# Patient Record
Sex: Female | Born: 1935 | Race: White | Hispanic: No | Smoking: Never smoker
Health system: Southern US, Community
[De-identification: ages and names within clinical notes are randomized; demographics above are authoritative.]

## PROBLEM LIST (undated history)

## (undated) DIAGNOSIS — G459 Transient cerebral ischemic attack, unspecified: Secondary | ICD-10-CM

## (undated) DIAGNOSIS — E039 Hypothyroidism, unspecified: Secondary | ICD-10-CM

## (undated) DIAGNOSIS — I10 Essential (primary) hypertension: Secondary | ICD-10-CM

## (undated) DIAGNOSIS — E785 Hyperlipidemia, unspecified: Secondary | ICD-10-CM

## (undated) DIAGNOSIS — I701 Atherosclerosis of renal artery: Secondary | ICD-10-CM

## (undated) HISTORY — DX: Hyperlipidemia, unspecified: E78.5

## (undated) HISTORY — DX: Essential (primary) hypertension: I10

## (undated) HISTORY — DX: Atherosclerosis of renal artery: I70.1

## (undated) HISTORY — DX: Hypothyroidism, unspecified: E03.9

## (undated) HISTORY — PX: CATARACT EXTRACTION: SUR2

## (undated) HISTORY — DX: Transient cerebral ischemic attack, unspecified: G45.9

---

## 2003-05-03 ENCOUNTER — Encounter: Payer: Self-pay | Admitting: Emergency Medicine

## 2003-05-03 ENCOUNTER — Emergency Department (HOSPITAL_COMMUNITY): Admission: EM | Admit: 2003-05-03 | Discharge: 2003-05-03 | Payer: Self-pay | Admitting: Emergency Medicine

## 2009-01-31 ENCOUNTER — Emergency Department (HOSPITAL_COMMUNITY): Admission: EM | Admit: 2009-01-31 | Discharge: 2009-01-31 | Payer: Self-pay | Admitting: Emergency Medicine

## 2009-04-17 ENCOUNTER — Encounter (INDEPENDENT_AMBULATORY_CARE_PROVIDER_SITE_OTHER): Payer: Self-pay | Admitting: Internal Medicine

## 2009-04-17 ENCOUNTER — Ambulatory Visit: Payer: Self-pay | Admitting: Cardiology

## 2009-04-17 ENCOUNTER — Observation Stay (HOSPITAL_COMMUNITY): Admission: EM | Admit: 2009-04-17 | Discharge: 2009-04-20 | Payer: Self-pay | Admitting: *Deleted

## 2009-05-06 ENCOUNTER — Encounter: Payer: Self-pay | Admitting: Cardiology

## 2009-05-19 ENCOUNTER — Ambulatory Visit (HOSPITAL_COMMUNITY): Admission: RE | Admit: 2009-05-19 | Discharge: 2009-05-19 | Payer: Self-pay | Admitting: Pulmonary Disease

## 2009-05-21 DIAGNOSIS — E785 Hyperlipidemia, unspecified: Secondary | ICD-10-CM | POA: Insufficient documentation

## 2009-05-21 DIAGNOSIS — E039 Hypothyroidism, unspecified: Secondary | ICD-10-CM | POA: Insufficient documentation

## 2009-05-21 DIAGNOSIS — I10 Essential (primary) hypertension: Secondary | ICD-10-CM | POA: Insufficient documentation

## 2009-05-21 DIAGNOSIS — G459 Transient cerebral ischemic attack, unspecified: Secondary | ICD-10-CM | POA: Insufficient documentation

## 2009-05-22 ENCOUNTER — Ambulatory Visit: Payer: Self-pay | Admitting: Cardiovascular Disease

## 2009-05-22 DIAGNOSIS — R9431 Abnormal electrocardiogram [ECG] [EKG]: Secondary | ICD-10-CM

## 2009-05-22 DIAGNOSIS — I701 Atherosclerosis of renal artery: Secondary | ICD-10-CM

## 2009-05-25 ENCOUNTER — Encounter: Payer: Self-pay | Admitting: Cardiovascular Disease

## 2009-05-25 ENCOUNTER — Ambulatory Visit: Payer: Self-pay

## 2009-06-18 ENCOUNTER — Ambulatory Visit: Payer: Self-pay | Admitting: Cardiovascular Disease

## 2010-11-11 ENCOUNTER — Ambulatory Visit (HOSPITAL_COMMUNITY)
Admission: RE | Admit: 2010-11-11 | Discharge: 2010-11-11 | Disposition: A | Discharge status: Home / Self Care | Payer: Medicare Other | Source: Ambulatory Visit | Attending: Pulmonary Disease | Admitting: Pulmonary Disease

## 2010-11-11 ENCOUNTER — Other Ambulatory Visit (HOSPITAL_COMMUNITY): Payer: Self-pay | Admitting: Pulmonary Disease

## 2010-11-11 DIAGNOSIS — R05 Cough: Secondary | ICD-10-CM

## 2010-11-11 DIAGNOSIS — J4489 Other specified chronic obstructive pulmonary disease: Secondary | ICD-10-CM | POA: Insufficient documentation

## 2010-11-11 DIAGNOSIS — R059 Cough, unspecified: Secondary | ICD-10-CM

## 2010-11-11 DIAGNOSIS — J449 Chronic obstructive pulmonary disease, unspecified: Secondary | ICD-10-CM | POA: Insufficient documentation

## 2010-11-19 LAB — LIPID PANEL
Cholesterol: 235 mg/dL — ABNORMAL HIGH (ref 0–200)
HDL: 63 mg/dL (ref >39)
Total CHOL/HDL Ratio: 3.7 RATIO

## 2010-11-19 LAB — DIFFERENTIAL
Basophils Absolute: 0 10*3/uL (ref 0.0–0.1)
Basophils Relative: 1 % (ref 0–1)
Eosinophils Absolute: 0.2 10*3/uL (ref 0.0–0.7)
Eosinophils Relative: 4 % (ref 0–5)
Neutrophils Relative %: 51 % (ref 43–77)

## 2010-11-19 LAB — CBC
HCT: 38.7 % (ref 36.0–46.0)
MCV: 87.6 fL (ref 78.0–100.0)
Platelets: 138 10*3/uL — ABNORMAL LOW (ref 150–400)
RDW: 13.4 % (ref 11.5–15.5)

## 2010-11-19 LAB — BASIC METABOLIC PANEL
BUN: 22 mg/dL (ref 6–23)
Chloride: 103 mEq/L (ref 96–112)
Creatinine, Ser: 1.06 mg/dL (ref 0.4–1.2)
Glucose, Bld: 100 mg/dL — ABNORMAL HIGH (ref 70–99)
Potassium: 3.7 mEq/L (ref 3.5–5.1)

## 2011-07-14 ENCOUNTER — Encounter: Payer: Self-pay | Admitting: Cardiovascular Disease

## 2012-07-12 ENCOUNTER — Observation Stay (HOSPITAL_COMMUNITY)
Admission: EM | Admit: 2012-07-12 | Discharge: 2012-07-14 | Disposition: A | Discharge status: Home Health | Payer: Medicare Other | Attending: Pulmonary Disease | Admitting: Pulmonary Disease

## 2012-07-12 ENCOUNTER — Encounter (HOSPITAL_COMMUNITY): Payer: Self-pay | Admitting: *Deleted

## 2012-07-12 ENCOUNTER — Emergency Department (HOSPITAL_COMMUNITY): Payer: Medicare Other

## 2012-07-12 ENCOUNTER — Other Ambulatory Visit: Payer: Self-pay

## 2012-07-12 DIAGNOSIS — E860 Dehydration: Secondary | ICD-10-CM | POA: Diagnosis present

## 2012-07-12 DIAGNOSIS — E871 Hypo-osmolality and hyponatremia: Secondary | ICD-10-CM | POA: Diagnosis present

## 2012-07-12 DIAGNOSIS — R9431 Abnormal electrocardiogram [ECG] [EKG]: Secondary | ICD-10-CM | POA: Diagnosis present

## 2012-07-12 DIAGNOSIS — E785 Hyperlipidemia, unspecified: Secondary | ICD-10-CM | POA: Diagnosis present

## 2012-07-12 DIAGNOSIS — I719 Aortic aneurysm of unspecified site, without rupture: Secondary | ICD-10-CM | POA: Diagnosis present

## 2012-07-12 DIAGNOSIS — R252 Cramp and spasm: Secondary | ICD-10-CM | POA: Diagnosis present

## 2012-07-12 DIAGNOSIS — I10 Essential (primary) hypertension: Secondary | ICD-10-CM | POA: Diagnosis present

## 2012-07-12 DIAGNOSIS — I951 Orthostatic hypotension: Secondary | ICD-10-CM | POA: Diagnosis present

## 2012-07-12 DIAGNOSIS — Q211 Atrial septal defect: Secondary | ICD-10-CM

## 2012-07-12 DIAGNOSIS — R55 Syncope and collapse: Principal | ICD-10-CM | POA: Diagnosis present

## 2012-07-12 DIAGNOSIS — I701 Atherosclerosis of renal artery: Secondary | ICD-10-CM | POA: Diagnosis present

## 2012-07-12 DIAGNOSIS — Q2112 Patent foramen ovale: Secondary | ICD-10-CM

## 2012-07-12 DIAGNOSIS — Z7982 Long term (current) use of aspirin: Secondary | ICD-10-CM | POA: Insufficient documentation

## 2012-07-12 DIAGNOSIS — N390 Urinary tract infection, site not specified: Secondary | ICD-10-CM | POA: Insufficient documentation

## 2012-07-12 DIAGNOSIS — Q2111 Secundum atrial septal defect: Secondary | ICD-10-CM | POA: Insufficient documentation

## 2012-07-12 DIAGNOSIS — E039 Hypothyroidism, unspecified: Secondary | ICD-10-CM | POA: Diagnosis present

## 2012-07-12 LAB — CBC
HCT: 30.3 % — ABNORMAL LOW (ref 36.0–46.0)
Hemoglobin: 10.5 g/dL — ABNORMAL LOW (ref 12.0–15.0)
MCH: 29.4 pg (ref 26.0–34.0)
MCHC: 34.7 g/dL (ref 30.0–36.0)
MCV: 84.9 fL (ref 78.0–100.0)
Platelets: 155 10*3/uL (ref 150–400)
RBC: 3.57 MIL/uL — ABNORMAL LOW (ref 3.87–5.11)
RDW: 12.8 % (ref 11.5–15.5)
WBC: 5.7 10*3/uL (ref 4.0–10.5)

## 2012-07-12 MED ORDER — ONDANSETRON HCL 4 MG/2ML IJ SOLN
INTRAMUSCULAR | Status: AC
  Administered 2012-07-12: 4 mg
  Filled 2012-07-12: qty 2

## 2012-07-12 MED ORDER — SODIUM CHLORIDE 0.9 % IV BOLUS (SEPSIS)
1000.0000 mL | Freq: Once | INTRAVENOUS | Status: AC
  Administered 2012-07-12: 1000 mL via INTRAVENOUS

## 2012-07-12 NOTE — ED Notes (Signed)
Right hand swollen and bruised from removal of heplock per judith rice ,rn.

## 2012-07-12 NOTE — ED Notes (Signed)
Pt states fell on left side and hip area, large raised area noted to said hip, no redness noted. Small quarter sized bruise noted. Pt states hip stings.  Bilateral lower extremities appear equal in size.  Pt states " just feel sick/bad". And is c/o nauseated. EMS administered 4 mg Zofran in route secondary to said nausea. Pt states was feeling faint, unsteady on her feet before falling. Pt denies LOC. Per both pt and EMS pt was up ambulating after fall without difficulty and a steady gait.  Pt denies use of cane or walker at home. NAD noted

## 2012-07-12 NOTE — ED Notes (Signed)
Per EMS pt had near syncope episode, Fall to left hip/side and is nauseated. EMS administered 4 mg Zofran and established an IV. Pt is from home, lives alone.

## 2012-07-12 NOTE — ED Provider Notes (Signed)
History   This chart was scribed for Raeford Razor, MD by Gerlean Ren, ED Scribe. This patient was seen in room APA02/APA02 and the patient's care was started at 11:04 PM    CSN: [id number]  Arrival date & time 07/12/12  2200   First MD Initiated Contact with Patient 07/12/12 2300      Chief Complaint  Patient presents with  . Fall  . Fatigue  . Nausea     The history is provided by the patient. No language interpreter was used.   Leah Casey is a 78 y.o. female with h/o HTN and TIA who presents to the Emergency Department complaining of syncopal episode tonight while bending over with no LOC. Pt reports only mild left hip pain from fall.  Per daughter, pt has h/o similar syncopal episodes but never loses consciousness and has never seen a neurologist.  Pt denies any obvious triggers or warnings when syncope will occur.  Pt denies associated dyspnea.  Pt states she has felt near-syncopal for the entire day.  Per daughter, pt has eaten regularly today.  Pt denies tobacco and alcohol use.     Past Medical History  Diagnosis Date  . Hypothyroidism   . Hyperlipidemia   . Hypertension   . TIA (transient ischemic attack)   . Renal artery stenosis     Past Surgical History  Procedure Date  . Cataract extraction     Right    Family History  Problem Relation Age of Onset  . Heart disease Mother   . Hypertension Mother     History  Substance Use Topics  . Smoking status: Unknown If Ever Smoked  . Smokeless tobacco: Not on file  . Alcohol Use: No    No OB history provided.   Review of Systems  Respiratory: Negative for shortness of breath.   Neurological: Positive for syncope.  All other systems reviewed and are negative.    Allergies  Sulfonamide derivatives  Home Medications   Current Outpatient Rx  Name  Route  Sig  Dispense  Refill  . ASPIRIN EC 81 MG PO TBEC   Oral   Take 81 mg by mouth every morning.         Marland Kitchen CLOPIDOGREL BISULFATE 75 MG PO  TABS   Oral   Take 75 mg by mouth every morning.          Marland Kitchen LEVOTHYROXINE SODIUM 125 MCG PO TABS   Oral   Take 125 mcg by mouth every morning. *Takes 30 minutes to 1 hour before taking other medications         . METOPROLOL SUCCINATE ER 50 MG PO TB24   Oral   Take 50 mg by mouth every morning.          Marland Kitchen OLMESARTAN-AMLODIPINE-HCTZ 40-5-12.5 MG PO TABS   Oral   Take 1 tablet by mouth every morning.          Marland Kitchen SYSTANE OP   Ophthalmic   Apply 1 drop to eye daily as needed. For dry eye relief         . PRAVASTATIN SODIUM 40 MG PO TABS   Oral   Take 40 mg by mouth every morning.            BP 161/51  Pulse 74  Temp 97.7 F (36.5 C) (Oral)  Resp 14  Ht 5\' 5"  (1.651 m)  Wt 120 lb (54.432 kg)  BMI 19.97 kg/m2  SpO2 98%  Physical Exam  Nursing note and vitals reviewed. Constitutional: She appears well-developed and well-nourished. No distress.       Not very comfortable appearing.  HENT:  Head: Normocephalic and atraumatic.  Eyes: Conjunctivae normal are normal. Right eye exhibits no discharge. Left eye exhibits no discharge.  Neck: Neck supple.  Cardiovascular: Normal rate, regular rhythm and normal heart sounds.  Exam reveals no gallop and no friction rub.   No murmur heard. Pulmonary/Chest: Effort normal and breath sounds normal. No respiratory distress.  Abdominal: Soft. She exhibits no distension. There is no tenderness.  Musculoskeletal: She exhibits no edema and no tenderness.       Hematoma over left lateral hip with tenderness.  Pelvis stable to rocking.  Pain with ROM left hip.  Neurovascularly intact distally.  No midline spinal tenderness.  Neurological: She is alert.  Skin: Skin is warm and dry.  Psychiatric: She has a normal mood and affect. Her behavior is normal. Thought content normal.    ED Course  Procedures (including critical care time) DIAGNOSTIC STUDIES: Oxygen Saturation is 98% on room air, normal by my interpretation.     COORDINATION OF CARE: 11:17 PM- Patient informed of clinical course, understands medical decision-making process, and agrees with plan.  Ordered PO Zofran, IV fluids, CBC, urinalysis, troponin, left hip CT w/o contrast.  Labs Reviewed  CBC - Abnormal; Notable for the following:    RBC 3.57 (*)     Hemoglobin 10.5 (*)     HCT 30.3 (*)     All other components within normal limits  BASIC METABOLIC PANEL  URINALYSIS, ROUTINE W REFLEX MICROSCOPIC  TROPONIN I   Dg Hip Complete Left  07/12/2012  *RADIOLOGY REPORT*  Clinical Data: Fall complaining of left hip pain.  LEFT HIP - COMPLETE 2+ VIEW  Comparison: No priors.  Findings: AP view of the pelvis and AP and lateral views of the left hip demonstrate no acute displaced fracture, subluxation, dislocation, joint or soft tissue abnormality.  Mild osteoarthritis is noted the hip joints bilaterally.  The bony pelvis appears grossly intact, as does the visualized portions of the right proximal femur.  IMPRESSION: 1.  No acute radiographic abnormality of the bony pelvis or the left hip. 2.  Mild bilateral hip joint osteoarthritis.   Original Report Authenticated By: Trudie Reed, M.D.    EKG:  Rhythm: normal sinus Vent. rate 77 BPM PR interval 224 ms QRS duration 146 ms QT/QTc 444/502 ms: ST segments: NS ST changes   1. Near syncope   2. Aortic aneurysm   3. Dehydration   4. Hyponatremia   5. HYPERTENSION       MDM  78 year old female with near syncope. Patient has not actually syncopized but she's been symptomatic enough to fall. These episodes have been recurrent as well. Patient's EKG shows new T wave inversions anteriorly and QTc is a little bit prolonged at 502 ms.  I personally preformed the services scribed in my presence. The recorded information has been reviewed is accurate. Raeford Razor, MD.        Raeford Razor, MD 07/16/12 [id number]

## 2012-07-13 ENCOUNTER — Observation Stay (HOSPITAL_COMMUNITY): Payer: Medicare Other

## 2012-07-13 ENCOUNTER — Encounter (HOSPITAL_COMMUNITY): Payer: Self-pay | Admitting: *Deleted

## 2012-07-13 DIAGNOSIS — I719 Aortic aneurysm of unspecified site, without rupture: Secondary | ICD-10-CM | POA: Diagnosis present

## 2012-07-13 DIAGNOSIS — E860 Dehydration: Secondary | ICD-10-CM | POA: Diagnosis present

## 2012-07-13 DIAGNOSIS — E871 Hypo-osmolality and hyponatremia: Secondary | ICD-10-CM

## 2012-07-13 DIAGNOSIS — R252 Cramp and spasm: Secondary | ICD-10-CM | POA: Diagnosis present

## 2012-07-13 DIAGNOSIS — R55 Syncope and collapse: Secondary | ICD-10-CM

## 2012-07-13 DIAGNOSIS — Q211 Atrial septal defect: Secondary | ICD-10-CM

## 2012-07-13 LAB — URINE MICROSCOPIC-ADD ON

## 2012-07-13 LAB — TSH: TSH: 0.222 u[IU]/mL — ABNORMAL LOW (ref 0.350–4.500)

## 2012-07-13 LAB — URINALYSIS, ROUTINE W REFLEX MICROSCOPIC
Bilirubin Urine: NEGATIVE
Glucose, UA: NEGATIVE mg/dL
Ketones, ur: NEGATIVE mg/dL
Leukocytes, UA: NEGATIVE
Nitrite: NEGATIVE
Protein, ur: NEGATIVE mg/dL
Specific Gravity, Urine: 1.015 (ref 1.005–1.030)
Urobilinogen, UA: 0.2 mg/dL (ref 0.0–1.0)
pH: 7 (ref 5.0–8.0)

## 2012-07-13 LAB — BASIC METABOLIC PANEL
BUN: 33 mg/dL — ABNORMAL HIGH (ref 6–23)
CO2: 24 mEq/L (ref 19–32)
Calcium: 8.9 mg/dL (ref 8.4–10.5)
Chloride: 98 mEq/L (ref 96–112)
Creatinine, Ser: 1.06 mg/dL (ref 0.50–1.10)
GFR calc Af Amer: 50 mL/min — ABNORMAL LOW (ref >90)
GFR calc non Af Amer: 43 mL/min — ABNORMAL LOW (ref >90)
Glucose, Bld: 133 mg/dL — ABNORMAL HIGH (ref 70–99)
Potassium: 4.2 mEq/L (ref 3.5–5.1)
Sodium: 131 mEq/L — ABNORMAL LOW (ref 135–145)

## 2012-07-13 LAB — HEPATIC FUNCTION PANEL
ALT: 20 U/L (ref 0–35)
Bilirubin, Direct: <0.1 mg/dL (ref 0.0–0.3)
Total Protein: 6.2 g/dL (ref 6.0–8.3)

## 2012-07-13 LAB — TROPONIN I: Troponin I: <0.3 ng/mL (ref <0.30)

## 2012-07-13 LAB — D-DIMER, QUANTITATIVE: D-Dimer, Quant: 2.27 ug/mL-FEU — ABNORMAL HIGH (ref 0.00–0.48)

## 2012-07-13 LAB — HEMOGLOBIN A1C: Mean Plasma Glucose: 123 mg/dL — ABNORMAL HIGH (ref <117)

## 2012-07-13 LAB — MAGNESIUM: Magnesium: 1.8 mg/dL (ref 1.5–2.5)

## 2012-07-13 MED ORDER — SODIUM CHLORIDE 0.9 % IJ SOLN
3.0000 mL | Freq: Two times a day (BID) | INTRAMUSCULAR | Status: DC
  Administered 2012-07-13: 3 mL via INTRAVENOUS

## 2012-07-13 MED ORDER — LEVOTHYROXINE SODIUM 25 MCG PO TABS
125.0000 ug | ORAL_TABLET | Freq: Every morning | ORAL | Status: DC
  Administered 2012-07-13 – 2012-07-14 (×2): 125 ug via ORAL
  Filled 2012-07-13 (×2): qty 1

## 2012-07-13 MED ORDER — IOHEXOL 350 MG/ML SOLN
100.0000 mL | Freq: Once | INTRAVENOUS | Status: AC | PRN
  Administered 2012-07-13: 100 mL via INTRAVENOUS

## 2012-07-13 MED ORDER — CLOPIDOGREL BISULFATE 75 MG PO TABS
75.0000 mg | ORAL_TABLET | Freq: Every morning | ORAL | Status: DC
  Administered 2012-07-13 – 2012-07-14 (×2): 75 mg via ORAL
  Filled 2012-07-13 (×2): qty 1

## 2012-07-13 MED ORDER — POLYETHYL GLYCOL-PROPYL GLYCOL 0.4-0.3 % OP SOLN: 1.0000 [drp] | Freq: Three times a day (TID) | OPHTHALMIC | Status: DC | PRN

## 2012-07-13 MED ORDER — DEXTROSE 5 % IV SOLN
1.0000 g | INTRAVENOUS | Status: DC
  Administered 2012-07-13 – 2012-07-14 (×2): 1 g via INTRAVENOUS
  Filled 2012-07-13 (×4): qty 10

## 2012-07-13 MED ORDER — ENOXAPARIN SODIUM 40 MG/0.4ML ~~LOC~~ SOLN: 40.0000 mg | SUBCUTANEOUS | Status: DC

## 2012-07-13 MED ORDER — ASPIRIN EC 81 MG PO TBEC
81.0000 mg | DELAYED_RELEASE_TABLET | Freq: Every morning | ORAL | Status: DC
  Administered 2012-07-13 – 2012-07-14 (×2): 81 mg via ORAL
  Filled 2012-07-13 (×2): qty 1

## 2012-07-13 MED ORDER — POLYVINYL ALCOHOL 1.4 % OP SOLN: 1.0000 [drp] | Freq: Three times a day (TID) | OPHTHALMIC | Status: DC | PRN

## 2012-07-13 MED ORDER — BISACODYL 5 MG PO TBEC: 5.0000 mg | DELAYED_RELEASE_TABLET | Freq: Every day | ORAL | Status: DC | PRN

## 2012-07-13 MED ORDER — ENOXAPARIN SODIUM 30 MG/0.3ML ~~LOC~~ SOLN
30.0000 mg | SUBCUTANEOUS | Status: DC
  Administered 2012-07-13: 30 mg via SUBCUTANEOUS
  Filled 2012-07-13 (×2): qty 0.3

## 2012-07-13 MED ORDER — AMLODIPINE BESYLATE 5 MG PO TABS: 5.0000 mg | ORAL_TABLET | Freq: Every day | ORAL | Status: DC

## 2012-07-13 MED ORDER — TRAZODONE HCL 50 MG PO TABS: 25.0000 mg | ORAL_TABLET | Freq: Every evening | ORAL | Status: DC | PRN

## 2012-07-13 MED ORDER — ACETAMINOPHEN 325 MG PO TABS: 650.0000 mg | ORAL_TABLET | Freq: Four times a day (QID) | ORAL | Status: DC | PRN

## 2012-07-13 MED ORDER — FLEET ENEMA 7-19 GM/118ML RE ENEM: 1.0000 | ENEMA | Freq: Once | RECTAL | Status: AC | PRN

## 2012-07-13 MED ORDER — IRBESARTAN 300 MG PO TABS
300.0000 mg | ORAL_TABLET | Freq: Every day | ORAL | Status: DC
  Filled 2012-07-13: qty 1

## 2012-07-13 MED ORDER — ONDANSETRON HCL 4 MG/2ML IJ SOLN: 4.0000 mg | Freq: Four times a day (QID) | INTRAMUSCULAR | Status: DC | PRN

## 2012-07-13 MED ORDER — POTASSIUM CHLORIDE IN NACL 20-0.9 MEQ/L-% IV SOLN
INTRAVENOUS | Status: DC
  Administered 2012-07-13: 18:00:00 via INTRAVENOUS
  Administered 2012-07-13: 1000 mL via INTRAVENOUS
  Administered 2012-07-14: 09:00:00 via INTRAVENOUS

## 2012-07-13 MED ORDER — METOPROLOL SUCCINATE ER 50 MG PO TB24: 50.0000 mg | ORAL_TABLET | Freq: Every morning | ORAL | Status: DC

## 2012-07-13 MED ORDER — SIMVASTATIN 20 MG PO TABS
20.0000 mg | ORAL_TABLET | Freq: Every day | ORAL | Status: DC
  Administered 2012-07-13: 20 mg via ORAL
  Filled 2012-07-13: qty 1

## 2012-07-13 NOTE — Progress Notes (Signed)
UR Chart Review Completed  

## 2012-07-13 NOTE — Progress Notes (Signed)
This is an assumption of care note. She is a 78 year old with a history of hypertension and what appears to have been renal artery stenosis. She did not undergo stenting of that considering her advanced age Leah Casey. She has been having near syncope and had an overt syncopal episode last night. She has had pain from a fall. She has an elevated d-dimer which may be related to the fall. She will need workup of this including carotid Doppler, echocardiogram, CT of the chest because of the elevated d-dimer and she may have a UTI so she's going to be on an antibiotic

## 2012-07-13 NOTE — Care Management Note (Unsigned)
    Page 1 of 1   07/13/2012     3:16:27 PM   CARE MANAGEMENT NOTE 07/13/2012  Patient:  SHERONICA, COREY   Account Number:  [id number]  Date Initiated:  07/13/2012  Documentation initiated by:  Sharrie Rothman  Subjective/Objective Assessment:   Pt admitted from home with syncope and possible UTI. Pt lives alone and has a daughter who lives next door and a son that lives in [place]. Pt wants to return home and has been fairly indpendent with ADL's.     Action/Plan:   CM provided a list of private duty sitter agencies to the pts son. Pt and family to discuss HH. If pt decides to have HH, weekend staff to arrange [facility] with agency that family chooses.   Anticipated DC Date:  07/14/2012   Anticipated DC Plan:  HOME W HOME HEALTH SERVICES      DC Planning Services  CM consult      Choice offered to / List presented to:             Status of service:  In process, will continue to follow Medicare Important Message given?   (If response is "NO", the following Medicare IM given date fields will be blank) Date Medicare IM given:   Date Additional Medicare IM given:    Discharge Disposition:    Per UR Regulation:    If discussed at Long Length of Stay Meetings, dates discussed:    Comments:  07/13/12 1515 Arlyss Queen, RN BSN CM

## 2012-07-13 NOTE — H&P (Signed)
[facility] History and Physical  Leah Casey  ZOX:[id number]  DOB: 1914/02/08   DOA: 07/13/2012   PCP:   Fredirick Maudlin, MD   Chief Complaint:  Recurrent dizziness  HPI: Leah Casey is an 78 y.o. female.   Mentally sharp, but very hard of hearing, elderly Caucasian lady who lives alone, appear to have been having recurrent episodes of near syncope for quite a while. Episodes occur at least weekly, and she has had> 4 episodes over the past 2 days. It this last episode today she felt dizzy, and then fell to the ground and injured her left hip. She did not lose consciousness, but says the episodes are associated with a fluttering in her chest.  She has no diaphoresis, chest pain or leg edema; denies nausea or vomiting. Her 2 daughters who are accompanying her says she probably has not been eating and drinking well. She admits to being very thirsty right now; she also complains of episodes of muscle cramping.  From the records she does have a history of relatively small thoracic and abdominal aortic aneurysms, diagnosed about 3 years ago, along with moderate bilateral renal artery stenosis.  She has had episodes of TIA characterized by transient loss of vision in one eye and weakness of one arm, for which she takes aspirin and Plavix, although she is not always compliant with aspirin.  X-rays in the emergency room reveal no evidence of fractures, and the hospitalist service was called to assist with management of recurrent syncope.  Rewiew of Systems:   All systems negative except as marked bold or noted in the HPI;  Constitutional: Negative for malaise, fever and chills. ;  Eyes: Negative for eye pain, redness and discharge. ;  ENMT: Negative for ear pain, hoarseness, nasal congestion, sinus pressure and sore throat. ;  Cardiovascular: Negative for chest pain,  diaphoresis, dyspnea and peripheral edema. ;  Respiratory: Negative for cough, hemoptysis, wheezing and stridor. ;    Gastrointestinal: Negative for nausea, vomiting, diarrhea, constipation, abdominal pain, melena, blood in stool, hematemesis, jaundice and rectal bleeding. unusual weight loss..   Genitourinary: Negative for frequency, dysuria, incontinence,flank pain and hematuria; Musculoskeletal: Negative for back pain and neck pain. Pain and swelling left hip from fall today.;  Skin: . Negative for pruritus, rash, abrasions,  and skin lesion.; ulcerations; bruise left hip Neuro: Negative for headache, and neck stiffness. Negative for weakness, altered level of consciousness , altered mental status, extremity weakness, burning feet, involuntary movement, seizure .  Psych: negative for anxiety, depression, insomnia, tearfulness, panic attacks, hallucinations, paranoia, suicidal or homicidal ideation    Past Medical History  Diagnosis Date  . Hypothyroidism   . Hyperlipidemia   . Hypertension   . TIA (transient ischemic attack)   . Renal artery stenosis     Past Surgical History  Procedure Date  . Cataract extraction     Right    Medications:  HOME MEDS: Prior to Admission medications   Medication Sig Start Date End Date Taking? Authorizing Provider  aspirin EC 81 MG tablet Take 81 mg by mouth every morning.   Yes Historical Provider, MD  clopidogrel (PLAVIX) 75 MG tablet Take 75 mg by mouth every morning.    Yes Historical Provider, MD  levothyroxine (SYNTHROID, LEVOTHROID) 125 MCG tablet Take 125 mcg by mouth every morning. *Takes 30 minutes to 1 hour before taking other medications   Yes Historical Provider, MD  metoprolol (TOPROL-XL) 50 MG 24 hr tablet Take 50 mg by mouth  every morning.    Yes Historical Provider, MD  Olmesartan-Amlodipine-HCTZ (TRIBENZOR) 40-5-12.5 MG TABS Take 1 tablet by mouth every morning.    Yes Historical Provider, MD  Polyethyl Glycol-Propyl Glycol (SYSTANE OP) Apply 1 drop to eye daily as needed. For dry eye relief   Yes Historical Provider, MD  pravastatin  (PRAVACHOL) 40 MG tablet Take 40 mg by mouth every morning.    Yes Historical Provider, MD     Allergies:  Allergies  Allergen Reactions  . Sulfonamide Derivatives     Social History:   has an unknown smoking status. She does not have any smokeless tobacco history on file. She reports that she does not drink alcohol or use illicit drugs.  Family History: Family History  Problem Relation Age of Onset  . Heart disease Mother   . Hypertension Mother      Physical Exam: Filed Vitals:   07/12/12 2328 07/13/12 0200 07/13/12 0215 07/13/12 0342  BP: 178/59   166/45  Pulse: 77 81 80 75  Temp:    98.1 F (36.7 C)  TempSrc:    Oral  Resp: 18 11 19 18   Height:    5\' 4"  (1.626 m)  Weight:    52.617 kg (116 lb)  SpO2: 98% 97% 96% 99%   Blood pressure 166/45, pulse 75, temperature 98.1 F (36.7 C), temperature source Oral, resp. rate 18, height 5\' 4"  (1.626 m), weight 52.617 kg (116 lb), SpO2 99.00%.  GEN:  Pleasant elderly Caucasian lady lying flat in the stretcher in no acute distress; cooperative with exam, but extremely hard of hearing despite bilateral hearing aids. PSYCH:  alert and oriented x4; does not appear anxious or depressed; affect is appropriate. HEENT: Mucous membranes pink, dry and anicteric; PERRLA; EOM intact; no cervical lymphadenopathy nor thyromegaly or carotid bruit; no JVD; Breasts:: Not examined CHEST WALL: No tenderness CHEST: Normal respiration, clear to auscultation bilaterally HEART: Regular rate and rhythm; early systolic murmur radiating more to the left carotid BACK: Marked kyphosis no scoliosis; no CVA tenderness ABDOMEN: soft non-tender; no masses, no organomegaly, normal abdominal bowel sounds; soft abdominal bruit no pannus; no intertriginous candida. Rectal Exam: Not done EXTREMITIES: No bone or joint deformity; age-appropriate arthropathy of the hands and knees; no edema; no ulcerations. Large Hematoma over left greater trochanter. Genitalia: not  examined PULSES: 2+ and symmetric; bounding radial pulses SKIN: Normal hydration no rash or ulceration CNS: Cranial nerves 2-12 grossly intact no focal lateralizing neurologic deficit   Labs on Admission:  Basic Metabolic Panel:  Lab 07/12/12 1610  NA 131*  K 4.2  CL 98  CO2 24  GLUCOSE 133*  BUN 33*  CREATININE 1.06  CALCIUM 8.9  MG --  PHOS --   Liver Function Tests: No results found for this basename: AST:5,ALT:5,ALKPHOS:5,BILITOT:5,PROT:5,ALBUMIN:5 in the last 168 hours No results found for this basename: LIPASE:5,AMYLASE:5 in the last 168 hours No results found for this basename: AMMONIA:5 in the last 168 hours CBC:  Lab 07/12/12 2333  WBC 5.7  NEUTROABS --  HGB 10.5*  HCT 30.3*  MCV 84.9  PLT 155   Cardiac Enzymes:  Lab 07/12/12 2333  CKTOTAL --  CKMB --  CKMBINDEX --  TROPONINI <0.30   BNP: No components found with this basename: POCBNP:5 D-dimer: No components found with this basename: D-DIMER:5 CBG: No results found for this basename: GLUCAP:5 in the last 168 hours  Radiological Exams on Admission: Dg Hip Complete Left  07/12/2012  *RADIOLOGY REPORT*  Clinical Data: Fall  complaining of left hip pain.  LEFT HIP - COMPLETE 2+ VIEW  Comparison: No priors.  Findings: AP view of the pelvis and AP and lateral views of the left hip demonstrate no acute displaced fracture, subluxation, dislocation, joint or soft tissue abnormality.  Mild osteoarthritis is noted the hip joints bilaterally.  The bony pelvis appears grossly intact, as does the visualized portions of the right proximal femur.  IMPRESSION: 1.  No acute radiographic abnormality of the bony pelvis or the left hip. 2.  Mild bilateral hip joint osteoarthritis.   Original Report Authenticated By: Trudie Reed, M.D.    Ct Hip Left Wo Contrast  07/13/2012  *RADIOLOGY REPORT*  Clinical Data: Status post fall; left hip pain.  CT OF THE LEFT HIP WITHOUT CONTRAST  Technique:  Multidetector CT imaging was  performed according to the standard protocol. Multiplanar CT image reconstructions were also generated.  Comparison: Left hip radiographs performed earlier today at 10:32 p.m.  Findings: There is no definite evidence of fracture or dislocation. The left femoral neck appears grossly intact on CT.  If the patient's symptoms persist, MRI could be considered for further evaluation, to exclude an occult nondisplaced fracture.  Both femoral heads remain seated at their respective acetabula. Mild vacuum phenomenon is seen at the sacroiliac joints; the pelvis is otherwise grossly unremarkable in appearance.  Mild degenerative change is noted at the pubic symphysis.  Focal soft tissue injury is noted overlying the left greater femoral trochanter and proximal left femur.  No hip joint effusion is identified.  Scattered vascular calcifications are seen. Diffuse diverticulosis is noted along the sigmoid colon.  IMPRESSION:  1.  No definite evidence of fracture or dislocation.  If the patient's symptoms persist, MRI could be considered for further evaluation, to exclude an occult nondisplaced fracture. 2.  Focal soft tissue injury overlying the left greater femoral trochanter and proximal left femur; no significant focal hematoma seen. 3.  Diffuse diverticulosis along the sigmoid colon. 4.  Scattered vascular calcifications seen.   Original Report Authenticated By: Tonia Ghent, M.D.     EKG: Independently reviewed. Sinus rhythm, first-degree AV block, right bundle branch block, not significantly changed from previous EKG 2010.   Assessment/Plan Present on Admission:  . Near syncope  Causes include dehydration , but given her history of palpitation, and long QT on EKG, cardiac arrhythmia also possible  . Dehydration, evidence by marked elevation in BUN/creatinine ratio, and ferrous  . Hyponatremia - probably due to dehydration  . Muscle cramps =- probably due to hyponatremia  . HYPOTHYROIDISM . HYPERLIPIDEMIA,  CONTROLLED . HYPERTENSION . ELECTROCARDIOGRAM, ABNORMAL . RENAL ARTERY STENOSIS . Aortic aneurysm   PLAN: We'll admit this lady for hydration and monitoring of her electrolytes;  repeat EKG in the morning, but not sure if  echo will be possible over this holiday period.  Ultrasound of the abdomen and the renal to assess for progression of aneurysm and stenosis. When she is hydrated may consider CT angiogram of the chest and abdomen to rule out episodic bleeding from aortic aneurysms  Pulmonary embolus also possible in the differential  Other plans as per orders.  Code Status: DO NOT  INTUBATE Family Communication: With daughter is present for interview and exam Disposition Plan: Per primary care physician later this morning    Jaquasia Doscher Nocturnist [facility] Pager [phone]   07/13/2012, 3:51 AM

## 2012-07-13 NOTE — Evaluation (Signed)
Physical Therapy Evaluation Patient Details Name: Leah Casey MRN: [id number] DOB: 1913/11/23 Today's Date: 07/13/2012 Time: 4098-1191 PT Time Calculation (min): 32 min  PT Assessment / Plan / Recommendation Clinical Impression  Pt is found to be at prior functional level on eval.  She did have orthostatic hypotension (decreased from:140/41 supine, 130/39 sitting, 118/40 standing) but had no sx of orthostasis.  She is up in a chair.  I have encouraged her to pauseafter a change of position before moving about to minimize syncope.    PT Assessment  Patent does not need any further PT services    Follow Up Recommendations  No PT follow up    Does the patient have the potential to tolerate intense rehabilitation      Barriers to Discharge        Equipment Recommendations  None recommended by PT    Recommendations for Other Services     Frequency      Precautions / Restrictions Precautions Precautions: Fall Restrictions Weight Bearing Restrictions: No   Pertinent Vitals/Pain      Mobility  Bed Mobility Bed Mobility: Supine to Sit;Sit to Supine Supine to Sit: 7: Independent Sit to Supine: 7: Independent Transfers Transfers: Stand to Sit;Sit to Stand Sit to Stand: 7: Independent Stand to Sit: 7: Independent Ambulation/Gait Ambulation/Gait Assistance: 6: Modified independent (Device/Increase time) Ambulation Distance (Feet): 250 Feet Assistive device: None Gait Pattern: Within Functional Limits Gait velocity: WNL General Gait Details: no instability Stairs: No Wheelchair Mobility Wheelchair Mobility: No    Shoulder Instructions     Exercises     PT Diagnosis:    PT Problem List:   PT Treatment Interventions:     PT Goals    Visit Information  Last PT Received On: 07/13/12    Subjective Data  Subjective: I'm feeling much better Patient Stated Goal: return home   Prior Functioning  Home Living Lives With: Alone Available Help at Discharge:  Family;Available 24 hours/day Type of Home: House Home Access: Level entry Home Layout: Laundry or work area in basement [employer]: Engineer, mining: None Prior Function Level of Independence: Independent Able to Take Stairs?: Yes Driving: Yes Vocation: Retired Comments: pt is a Leisure centre manager [employer] Communication Communication: HOH    Cognition  Overall Cognitive Status: Appears within functional limits for tasks assessed/performed Arousal/Alertness: Awake/alert Orientation Level: Appears intact for tasks assessed Behavior During Session: [facility] for tasks performed    Extremity/Trunk Assessment Right Upper Extremity Assessment RUE ROM/Strength/Tone: Within functional levels Left Upper Extremity Assessment LUE ROM/Strength/Tone: Within functional levels Right Lower Extremity Assessment RLE ROM/Strength/Tone: Within functional levels Left Lower Extremity Assessment LLE ROM/Strength/Tone: Within functional levels   Balance Balance Balance Assessed: No (WNL by functional observation)  End of Session PT - End of Session Equipment Utilized During Treatment: Gait belt Activity Tolerance: Patient tolerated treatment well Patient left: in chair;with call bell/phone within reach;with family/visitor present Nurse Communication: Mobility status  GP Functional Assessment Tool Used: clinical judgement Functional Limitation: Mobility: Walking and moving around Mobility: Walking and Moving Around Current Status [phone]): 0 percent impaired, limited or restricted Mobility: Walking and Moving Around Goal Status [phone]): 0 percent impaired, limited or restricted Mobility: Walking and Moving Around Discharge Status [phone]): 0 percent impaired, limited or restricted   Myrlene Broker L 07/13/2012, 11:49 AM

## 2012-07-14 DIAGNOSIS — I951 Orthostatic hypotension: Secondary | ICD-10-CM | POA: Diagnosis present

## 2012-07-14 MED ORDER — OLMESARTAN-AMLODIPINE-HCTZ 40-5-12.5 MG PO TABS: 0.5000 | ORAL_TABLET | Freq: Every morning | ORAL | Status: AC

## 2012-07-14 MED ORDER — CIPROFLOXACIN HCL 250 MG PO TABS: 250.0000 mg | ORAL_TABLET | Freq: Two times a day (BID) | ORAL | Status: AC

## 2012-07-14 NOTE — Progress Notes (Signed)
Subjective: She feels better. She had marked orthostatic changes but was actually asymptomatic from that. Her testing so far does not show any other acute problem but does show that she did has aortic aneurysms which are known about  Objective: Vital signs in last 24 hours: Temp:  [97.5 F (36.4 C)-98.1 F (36.7 C)] 97.5 F (36.4 C) (11/30 0656) Pulse Rate:  [69-80] 80  (11/30 0704) Resp:  [18-20] 20  (11/30 0659) BP: (132-188)/(41-76) 132/50 mmHg (11/30 0704) SpO2:  [99 %-100 %] 100 % (11/30 0704) Weight:  [53.1 kg (117 lb 1 oz)] 53.1 kg (117 lb 1 oz) (11/30 0500) Weight change: -1.332 kg (-2 lb 15 oz) Last BM Date: 07/13/12  Intake/Output from previous day: 11/29 0701 - 11/30 0700 In: 2520 [P.O.:720; I.V.:1800] Out: 1150 [Urine:1150]  PHYSICAL EXAM General appearance: alert, cooperative and no distress Resp: clear to auscultation bilaterally Cardio: regular rate and rhythm, S1, S2 normal, no murmur, click, rub or gallop GI: soft, non-tender; bowel sounds normal; no masses,  no organomegaly Extremities: extremities normal, atraumatic, no cyanosis or edema  Lab Results:    Basic Metabolic Panel:  Basename 07/13/12 0345 07/12/12 2333  NA -- 131*  K -- 4.2  CL -- 98  CO2 -- 24  GLUCOSE -- 133*  BUN -- 33*  CREATININE -- 1.06  CALCIUM -- 8.9  MG 1.8 --  PHOS -- --   Liver Function Tests:  Basename 07/13/12 0345  AST 27  ALT 20  ALKPHOS 82  BILITOT 0.3  PROT 6.2  ALBUMIN 3.7   No results found for this basename: LIPASE:2,AMYLASE:2 in the last 72 hours No results found for this basename: AMMONIA:2 in the last 72 hours CBC:  Basename 07/12/12 2333  WBC 5.7  NEUTROABS --  HGB 10.5*  HCT 30.3*  MCV 84.9  PLT 155   Cardiac Enzymes:  Basename 07/12/12 2333  CKTOTAL --  CKMB --  CKMBINDEX --  TROPONINI <0.30   BNP: No results found for this basename: PROBNP:3 in the last 72 hours D-Dimer:  Alvira Philips 07/13/12 0657  DDIMER 2.27*   CBG: No results  found for this basename: GLUCAP:6 in the last 72 hours Hemoglobin A1C:  Basename 07/13/12 0345  HGBA1C 5.9*   Fasting Lipid Panel: No results found for this basename: CHOL,HDL,LDLCALC,TRIG,CHOLHDL,LDLDIRECT in the last 72 hours Thyroid Function Tests:  Basename 07/13/12 0247  TSH 0.222*  T4TOTAL --  FREET4 --  T3FREE --  THYROIDAB --   Anemia Panel: No results found for this basename: VITAMINB12,FOLATE,FERRITIN,TIBC,IRON,RETICCTPCT in the last 72 hours Coagulation: No results found for this basename: LABPROT:2,INR:2 in the last 72 hours Urine Drug Screen: Drugs of Abuse  No results found for this basename: labopia, cocainscrnur, labbenz, amphetmu, thcu, labbarb    Alcohol Level: No results found for this basename: ETH:2 in the last 72 hours Urinalysis:  Basename 07/13/12 0112  COLORURINE STRAW*  LABSPEC 1.015  PHURINE 7.0  GLUCOSEU NEGATIVE  HGBUR SMALL*  BILIRUBINUR NEGATIVE  KETONESUR NEGATIVE  PROTEINUR NEGATIVE  UROBILINOGEN 0.2  NITRITE NEGATIVE  LEUKOCYTESUR NEGATIVE   Misc. Labs:  ABGS No results found for this basename: PHART,PCO2,PO2ART,TCO2,HCO3 in the last 72 hours CULTURES No results found for this or any previous visit (from the past 240 hour(s)). Studies/Results: Dg Hip Complete Left  07/12/2012  *RADIOLOGY REPORT*  Clinical Data: Fall complaining of left hip pain.  LEFT HIP - COMPLETE 2+ VIEW  Comparison: No priors.  Findings: AP view of the pelvis and AP and lateral  views of the left hip demonstrate no acute displaced fracture, subluxation, dislocation, joint or soft tissue abnormality.  Mild osteoarthritis is noted the hip joints bilaterally.  The bony pelvis appears grossly intact, as does the visualized portions of the right proximal femur.  IMPRESSION: 1.  No acute radiographic abnormality of the bony pelvis or the left hip. 2.  Mild bilateral hip joint osteoarthritis.   Original Report Authenticated By: Trudie Reed, M.D.    Ct Angio Chest  Pe W/cm &/or Wo Cm  07/13/2012  *RADIOLOGY REPORT*  Clinical Data: Elevated D-dimer  CT ANGIOGRAPHY CHEST  Technique:  Multidetector CT imaging of the chest using the standard protocol during bolus administration of intravenous contrast. Multiplanar reconstructed images including MIPs were obtained and reviewed to evaluate the vascular anatomy.  Contrast: OMNIPAQUE IOHEXOL 350 MG/ML SOLN  Comparison: Chest radiograph 11/11/2010  Findings: There is moderate cardiomegaly.  Coronary artery atherosclerotic calcifications are present.  There is extensive atherosclerotic calcification of the thoracic aorta.  There is focal aneurysmal dilatation of the descending thoracic aorta, measuring up to 3.6 cm transverse diameter by 3.0 cm AP diameter.  This aneurysmal dilatation and spans approximately 3 cm in length.  Negative for aortic dissection.  This is a technically very good examination of the pulmonary arteries. The pulmonary arteries are well opacified with contrast. There are no filling defects in the pulmonary artery branches.  Negative for pleural or pericardial effusion.  There is a 7 mm short axis interpectoral lymph node on the right. A 7 mm short axis right axillary lymph node, level II is noted. These are of uncertain significance.  No pathologically enlarged mediastinal or hilar lymph nodes.  The trachea and mainstem bronchi are patent.  The lungs are well expanded.  Aside from focal atelectasis or scarring at the medial in the medial right lower lobe, the lungs are well expanded and clear.  Negative for pneumothorax.  There is a moderate sized hiatal hernia.  Esophagus is unremarkable.  Multilevel degenerative changes of the thoracic spine.  The vertebral bodies are normal in height and alignment.  No fracture or suspicious bony abnormality.  The imaged portion of the upper abdomen demonstrates probable mild aneurysmal dilatation of the proximal abdominal aorta, measuring 3.1 cm AP diameter on the  inferior-most image (image #[id number]).  At this level, there is a curvilinear filling defect within the right aspect of the aortic lumen that measures approximately 8 x 2 mm, likely reflecting mural-based plaque or thrombus.  IMPRESSION:  1.  Negative for pulmonary embolism. 2.  Heavy atherosclerosis of the thoracic aorta, with focal aneurysmal dilatation of the descending thoracic aorta (3.6 cm transverse diameter by 3.0 cm AP diameter) 3.  Focal aneurysmal dilatation of the visualized proximal abdominal aorta to 3.1 cm AP diameter. There is curvilinear plaque and/or thrombus along the right aspect of the abdominal aorta at the level of the aneurysm. 4.  Moderate cardiomegaly and coronary artery atherosclerosis. 5.  Moderate sized hiatal hernia. 6.  No acute findings in the lungs.   Original Report Authenticated By: Britta Mccreedy, M.D.    Ct Hip Left Wo Contrast  07/13/2012  *RADIOLOGY REPORT*  Clinical Data: Status post fall; left hip pain.  CT OF THE LEFT HIP WITHOUT CONTRAST  Technique:  Multidetector CT imaging was performed according to the standard protocol. Multiplanar CT image reconstructions were also generated.  Comparison: Left hip radiographs performed earlier today at 10:32 p.m.  Findings: There is no definite evidence of fracture or dislocation.  The left femoral neck appears grossly intact on CT.  If the patient's symptoms persist, MRI could be considered for further evaluation, to exclude an occult nondisplaced fracture.  Both femoral heads remain seated at their respective acetabula. Mild vacuum phenomenon is seen at the sacroiliac joints; the pelvis is otherwise grossly unremarkable in appearance.  Mild degenerative change is noted at the pubic symphysis.  Focal soft tissue injury is noted overlying the left greater femoral trochanter and proximal left femur.  No hip joint effusion is identified.  Scattered vascular calcifications are seen. Diffuse diverticulosis is noted along the sigmoid colon.   IMPRESSION:  1.  No definite evidence of fracture or dislocation.  If the patient's symptoms persist, MRI could be considered for further evaluation, to exclude an occult nondisplaced fracture. 2.  Focal soft tissue injury overlying the left greater femoral trochanter and proximal left femur; no significant focal hematoma seen. 3.  Diffuse diverticulosis along the sigmoid colon. 4.  Scattered vascular calcifications seen.   Original Report Authenticated By: Tonia Ghent, M.D.    US Carotid Duplex Bilateral  07/13/2012  *RADIOLOGY REPORT*  Clinical Data: Syncope, hypertension.  BILATERAL CAROTID DUPLEX ULTRASOUND  Technique: Wallace Cullens scale imaging, color Doppler and duplex ultrasound was performed of bilateral carotid and vertebral arteries in the neck.  Comparison: 04/17/2009  Criteria:  Quantification of carotid stenosis is based on velocity parameters that correlate the residual internal carotid diameter with NASCET-based stenosis levels, using the diameter of the distal internal carotid lumen as the denominator for stenosis measurement.  The following velocity measurements were obtained:                   PEAK SYSTOLIC/END DIASTOLIC RIGHT ICA:                        161/10cm/sec CCA:                        124/7cm/sec SYSTOLIC ICA/CCA RATIO:     1.3 DIASTOLIC ICA/CCA RATIO:    1.56 ECA:                        177cm/sec  LEFT ICA:                        166/12cm/sec CCA:                        116/5cm/sec SYSTOLIC ICA/CCA RATIO:     1.44 DIASTOLIC ICA/CCA RATIO:    2.32 ECA:                        248cm/sec  Findings:  RIGHT CAROTID ARTERY: Eccentric plaque in the distal common carotid artery.  Partially calcified plaque in the carotid bulb extending into proximal internal and external carotid arteries without high- grade stenosis.  Normal wave forms and color Doppler signal.  RIGHT VERTEBRAL ARTERY:  Normal flow direction with a relatively diminutive waveform.  LEFT CAROTID ARTERY: Eccentric partially calcified  plaque in the mid distal common carotid artery.  Partially calcified plaque in the carotid bulb extending into proximal internal and external carotid arteries.  Normal wave forms.  Distal ICA is tortuous.  LEFT VERTEBRAL ARTERY:  Normal flow direction and waveform.  IMPRESSION:  1.  Bilateral carotid bifurcation proximal ICA plaque, resulting in less than 50% diameter stenosis. The exam  does not exclude plaque ulceration or embolization.  Continued surveillance recommended.   Original Report Authenticated By: D. Andria Rhein, MD     Medications:  Prior to Admission:  Prescriptions prior to admission  Medication Sig Dispense Refill  . aspirin EC 81 MG tablet Take 81 mg by mouth every morning.      . clopidogrel (PLAVIX) 75 MG tablet Take 75 mg by mouth every morning.       Marland Kitchen levothyroxine (SYNTHROID, LEVOTHROID) 125 MCG tablet Take 125 mcg by mouth every morning. *Takes 30 minutes to 1 hour before taking other medications      . metoprolol (TOPROL-XL) 50 MG 24 hr tablet Take 50 mg by mouth every morning.       . Olmesartan-Amlodipine-HCTZ (TRIBENZOR) 40-5-12.5 MG TABS Take 1 tablet by mouth every morning.       Bertram Gala Glycol-Propyl Glycol (SYSTANE OP) Apply 1 drop to eye daily as needed. For dry eye relief      . pravastatin (PRAVACHOL) 40 MG tablet Take 40 mg by mouth every morning.        Scheduled:   . amLODipine  5 mg Oral Daily  . aspirin EC  81 mg Oral q morning - 10a  . cefTRIAXone (ROCEPHIN)  IV  1 g Intravenous Q24H  . clopidogrel  75 mg Oral q morning - 10a  . enoxaparin (LOVENOX) injection  30 mg Subcutaneous Q24H  . irbesartan  300 mg Oral Daily  . levothyroxine  125 mcg Oral q morning - 10a  . metoprolol succinate  50 mg Oral q morning - 10a  . simvastatin  20 mg Oral q1800  . sodium chloride  3 mL Intravenous Q12H   Continuous:   . 0.9 % NaCl with KCl 20 mEq / L 75 mL/hr at 07/14/12 8119   JYN:[id number], bisacodyl, [COMPLETED] iohexol, ondansetron (ZOFRAN) IV,  polyvinyl alcohol, [EXPIRED] sodium phosphate, traZODone  Assesment: She has orthostatic hypotension. I think that's the cause of near syncope. She has hypertension so it will be more difficult to balance her blood pressure versus orthostasis I think she's okay to go home Principal Problem:  *Near syncope Active Problems:  HYPOTHYROIDISM  HYPERLIPIDEMIA, CONTROLLED  HYPERTENSION  RENAL ARTERY STENOSIS  ELECTROCARDIOGRAM, ABNORMAL  Dehydration  Hyponatremia  Muscle cramps  Aortic aneurysm  PFO (patent foramen ovale)    Plan: She's going to have TED hose. She will go home. Her daughters are going to stay with her for now. She will have home health services    LOS: 2 days   Leah Casey L 07/14/2012, 10:20 AM

## 2012-07-14 NOTE — Discharge Summary (Signed)
Physician Discharge Summary  Patient ID: Leah Casey MRN: [id number] DOB/AGE: 1914/01/06 78 y.o. Primary Care Physician:Uri Covey L, MD Admit date: 07/12/2012 Discharge date: 07/14/2012    Discharge Diagnoses:   Principal Problem:  *Near syncope Active Problems:  HYPOTHYROIDISM  HYPERLIPIDEMIA, CONTROLLED  HYPERTENSION  RENAL ARTERY STENOSIS  ELECTROCARDIOGRAM, ABNORMAL  Dehydration  Hyponatremia  Muscle cramps  Aortic aneurysm  PFO (patent foramen ovale)  Orthostatic hypotension  urinary tract infection    Medication List     As of 07/14/2012 10:30 AM    TAKE these medications         aspirin EC 81 MG tablet   Take 81 mg by mouth every morning.      ciprofloxacin 250 MG tablet   Commonly known as: CIPRO   Take 1 tablet (250 mg total) by mouth 2 (two) times daily.      clopidogrel 75 MG tablet   Commonly known as: PLAVIX   Take 75 mg by mouth every morning.      levothyroxine 125 MCG tablet   Commonly known as: SYNTHROID, LEVOTHROID   Take 125 mcg by mouth every morning. *Takes 30 minutes to 1 hour before taking other medications      metoprolol succinate 50 MG 24 hr tablet   Commonly known as: TOPROL-XL   Take 50 mg by mouth every morning.      Olmesartan-Amlodipine-HCTZ 40-5-12.5 MG Tabs   Take 0.5 tablets by mouth every morning.      pravastatin 40 MG tablet   Commonly known as: PRAVACHOL   Take 40 mg by mouth every morning.      SYSTANE OP   Apply 1 drop to eye daily as needed. For dry eye relief        Discharged Condition: Improved    Consults: None  Significant Diagnostic Studies: Dg Hip Complete Left  07/12/2012  *RADIOLOGY REPORT*  Clinical Data: Fall complaining of left hip pain.  LEFT HIP - COMPLETE 2+ VIEW  Comparison: No priors.  Findings: AP view of the pelvis and AP and lateral views of the left hip demonstrate no acute displaced fracture, subluxation, dislocation, joint or soft tissue abnormality.  Mild osteoarthritis  is noted the hip joints bilaterally.  The bony pelvis appears grossly intact, as does the visualized portions of the right proximal femur.  IMPRESSION: 1.  No acute radiographic abnormality of the bony pelvis or the left hip. 2.  Mild bilateral hip joint osteoarthritis.   Original Report Authenticated By: Trudie Reed, M.D.    Ct Angio Chest Pe W/cm &/or Wo Cm  07/13/2012  *RADIOLOGY REPORT*  Clinical Data: Elevated D-dimer  CT ANGIOGRAPHY CHEST  Technique:  Multidetector CT imaging of the chest using the standard protocol during bolus administration of intravenous contrast. Multiplanar reconstructed images including MIPs were obtained and reviewed to evaluate the vascular anatomy.  Contrast: OMNIPAQUE IOHEXOL 350 MG/ML SOLN  Comparison: Chest radiograph 11/11/2010  Findings: There is moderate cardiomegaly.  Coronary artery atherosclerotic calcifications are present.  There is extensive atherosclerotic calcification of the thoracic aorta.  There is focal aneurysmal dilatation of the descending thoracic aorta, measuring up to 3.6 cm transverse diameter by 3.0 cm AP diameter.  This aneurysmal dilatation and spans approximately 3 cm in length.  Negative for aortic dissection.  This is a technically very good examination of the pulmonary arteries. The pulmonary arteries are well opacified with contrast. There are no filling defects in the pulmonary artery branches.  Negative for pleural or pericardial  effusion.  There is a 7 mm short axis interpectoral lymph node on the right. A 7 mm short axis right axillary lymph node, level II is noted. These are of uncertain significance.  No pathologically enlarged mediastinal or hilar lymph nodes.  The trachea and mainstem bronchi are patent.  The lungs are well expanded.  Aside from focal atelectasis or scarring at the medial in the medial right lower lobe, the lungs are well expanded and clear.  Negative for pneumothorax.  There is a moderate sized hiatal hernia.   Esophagus is unremarkable.  Multilevel degenerative changes of the thoracic spine.  The vertebral bodies are normal in height and alignment.  No fracture or suspicious bony abnormality.  The imaged portion of the upper abdomen demonstrates probable mild aneurysmal dilatation of the proximal abdominal aorta, measuring 3.1 cm AP diameter on the inferior-most image (image #90).  At this level, there is a curvilinear filling defect within the right aspect of the aortic lumen that measures approximately 8 x 2 mm, likely reflecting mural-based plaque or thrombus.  IMPRESSION:  1.  Negative for pulmonary embolism. 2.  Heavy atherosclerosis of the thoracic aorta, with focal aneurysmal dilatation of the descending thoracic aorta (3.6 cm transverse diameter by 3.0 cm AP diameter) 3.  Focal aneurysmal dilatation of the visualized proximal abdominal aorta to 3.1 cm AP diameter. There is curvilinear plaque and/or thrombus along the right aspect of the abdominal aorta at the level of the aneurysm. 4.  Moderate cardiomegaly and coronary artery atherosclerosis. 5.  Moderate sized hiatal hernia. 6.  No acute findings in the lungs.   Original Report Authenticated By: Britta Mccreedy, M.D.    Ct Hip Left Wo Contrast  07/13/2012  *RADIOLOGY REPORT*  Clinical Data: Status post fall; left hip pain.  CT OF THE LEFT HIP WITHOUT CONTRAST  Technique:  Multidetector CT imaging was performed according to the standard protocol. Multiplanar CT image reconstructions were also generated.  Comparison: Left hip radiographs performed earlier today at 10:32 p.m.  Findings: There is no definite evidence of fracture or dislocation. The left femoral neck appears grossly intact on CT.  If the patient's symptoms persist, MRI could be considered for further evaluation, to exclude an occult nondisplaced fracture.  Both femoral heads remain seated at their respective acetabula. Mild vacuum phenomenon is seen at the sacroiliac joints; the pelvis is otherwise  grossly unremarkable in appearance.  Mild degenerative change is noted at the pubic symphysis.  Focal soft tissue injury is noted overlying the left greater femoral trochanter and proximal left femur.  No hip joint effusion is identified.  Scattered vascular calcifications are seen. Diffuse diverticulosis is noted along the sigmoid colon.  IMPRESSION:  1.  No definite evidence of fracture or dislocation.  If the patient's symptoms persist, MRI could be considered for further evaluation, to exclude an occult nondisplaced fracture. 2.  Focal soft tissue injury overlying the left greater femoral trochanter and proximal left femur; no significant focal hematoma seen. 3.  Diffuse diverticulosis along the sigmoid colon. 4.  Scattered vascular calcifications seen.   Original Report Authenticated By: Tonia Ghent, M.D.    US Carotid Duplex Bilateral  07/13/2012  *RADIOLOGY REPORT*  Clinical Data: Syncope, hypertension.  BILATERAL CAROTID DUPLEX ULTRASOUND  Technique: Wallace Cullens scale imaging, color Doppler and duplex ultrasound was performed of bilateral carotid and vertebral arteries in the neck.  Comparison: 04/17/2009  Criteria:  Quantification of carotid stenosis is based on velocity parameters that correlate the residual internal carotid diameter with  NASCET-based stenosis levels, using the diameter of the distal internal carotid lumen as the denominator for stenosis measurement.  The following velocity measurements were obtained:                   PEAK SYSTOLIC/END DIASTOLIC RIGHT ICA:                        161/10cm/sec CCA:                        124/7cm/sec SYSTOLIC ICA/CCA RATIO:     1.3 DIASTOLIC ICA/CCA RATIO:    1.56 ECA:                        177cm/sec  LEFT ICA:                        166/12cm/sec CCA:                        116/5cm/sec SYSTOLIC ICA/CCA RATIO:     1.44 DIASTOLIC ICA/CCA RATIO:    2.32 ECA:                        248cm/sec  Findings:  RIGHT CAROTID ARTERY: Eccentric plaque in the distal common  carotid artery.  Partially calcified plaque in the carotid bulb extending into proximal internal and external carotid arteries without high- grade stenosis.  Normal wave forms and color Doppler signal.  RIGHT VERTEBRAL ARTERY:  Normal flow direction with a relatively diminutive waveform.  LEFT CAROTID ARTERY: Eccentric partially calcified plaque in the mid distal common carotid artery.  Partially calcified plaque in the carotid bulb extending into proximal internal and external carotid arteries.  Normal wave forms.  Distal ICA is tortuous.  LEFT VERTEBRAL ARTERY:  Normal flow direction and waveform.  IMPRESSION:  1.  Bilateral carotid bifurcation proximal ICA plaque, resulting in less than 50% diameter stenosis. The exam does not exclude plaque ulceration or embolization.  Continued surveillance recommended.   Original Report Authenticated By: D. Andria Rhein, MD     Lab Results: Basic Metabolic Panel:  Basename 07/13/12 0345 07/12/12 2333  NA -- 131*  K -- 4.2  CL -- 98  CO2 -- 24  GLUCOSE -- 133*  BUN -- 33*  CREATININE -- 1.06  CALCIUM -- 8.9  MG 1.8 --  PHOS -- --   Liver Function Tests:  [facility] 07/13/12 0345  AST 27  ALT 20  ALKPHOS 82  BILITOT 0.3  PROT 6.2  ALBUMIN 3.7     CBC:  Basename 07/12/12 2333  WBC 5.7  NEUTROABS --  HGB 10.5*  HCT 30.3*  MCV 84.9  PLT 155    No results found for this or any previous visit (from the past 240 hour(s)).   Hospital Course: She was admitted with near syncope. She has had significant episodes of feeling weak for several months. She has some urinary symptoms. Exam showed that she was awake and alert. She did injure her hip when she fell. She had significant orthostatic hypotension but that was actually asymptomatic. She underwent a CT of the chest because of an elevated d-dimer and did not show pulmonary emboli but did show a thoracic and abdominal aortic aneurysm which were known. She had what appeared to be a urinary tract  infection and the organism and sensitivities  are pending. By the time of discharge she was up sitting up with no difficulty reading and feeling well  Discharge Exam: Blood pressure 132/50, pulse 80, temperature 97.5 F (36.4 C), temperature source Oral, resp. rate 20, height 5\' 4"  (1.626 m), weight 53.1 kg (117 lb 1 oz), SpO2 100.00%. She has orthostatic changes still but she is asymptomatic. She is neurologically totally intact. Her chest is clear. Her heart is regular.  Disposition: Home with home health services. She will try TED hose first and if that does not help resolve her orthostasis she may have to start medication      Discharge Orders    Future Orders Please Complete By Expires   DME Bedside commode      Home Health      Questions: Responses:   To provide the following care/treatments PT    RN   Face-to-face encounter      Comments:   I Isola Mehlman L certify that this patient is under my care and that I, or a nurse practitioner or physician's assistant working with me, had a face-to-face encounter that meets the physician face-to-face encounter requirements with this patient on 07/14/2012. The encounter with the patient was in whole, or in part for the following medical condition(s) which is the primary reason for home health care (List medical condition): Near syncope/orthostatic hypotension   Questions: Responses:   The encounter with the patient was in whole, or in part, for the following medical condition, which is the primary reason for home health care Near syncope/orthostatic hypotension   I certify that, based on my findings, the following services are medically necessary home health services Nursing    Physical therapy   My clinical findings support the need for the above services Unsafe ambulation due to balance issues   Further, I certify that my clinical findings support that this patient is homebound due to: Unsafe ambulation due to balance issues   To provide  the following care/treatments PT    RN   Discharge patient           Signed: Fredirick Maudlin Pager [phone]  07/14/2012, 10:30 AM

## 2012-07-14 NOTE — Plan of Care (Signed)
Problem: Discharge Progression Outcomes Goal: Complications resolved/controlled Outcome: Completed/Met Date Met:  07/14/12 Medication changes reviewed with pt and family Goal: Activity appropriate for discharge plan Outcome: Completed/Met Date Met:  07/14/12 oob in chair Goal: Other Discharge Outcomes/Goals Outcome: Completed/Met Date Met:  07/14/12 Discharged with family  [facility] arranged to follow pt per order

## 2014-12-19 ENCOUNTER — Emergency Department (HOSPITAL_COMMUNITY): Payer: No Typology Code available for payment source

## 2014-12-19 ENCOUNTER — Emergency Department (HOSPITAL_COMMUNITY)
Admission: EM | Admit: 2014-12-19 | Discharge: 2014-12-19 | Disposition: A | Discharge status: Home / Self Care | Payer: No Typology Code available for payment source | Attending: Emergency Medicine | Admitting: Emergency Medicine

## 2014-12-19 ENCOUNTER — Encounter (HOSPITAL_COMMUNITY): Payer: Self-pay | Admitting: Emergency Medicine

## 2014-12-19 DIAGNOSIS — E785 Hyperlipidemia, unspecified: Secondary | ICD-10-CM | POA: Diagnosis not present

## 2014-12-19 DIAGNOSIS — Z79899 Other long term (current) drug therapy: Secondary | ICD-10-CM | POA: Insufficient documentation

## 2014-12-19 DIAGNOSIS — I10 Essential (primary) hypertension: Secondary | ICD-10-CM | POA: Insufficient documentation

## 2014-12-19 DIAGNOSIS — S7012XA Contusion of left thigh, initial encounter: Secondary | ICD-10-CM | POA: Diagnosis not present

## 2014-12-19 DIAGNOSIS — Z7902 Long term (current) use of antithrombotics/antiplatelets: Secondary | ICD-10-CM | POA: Insufficient documentation

## 2014-12-19 DIAGNOSIS — Y998 Other external cause status: Secondary | ICD-10-CM | POA: Diagnosis not present

## 2014-12-19 DIAGNOSIS — Z7982 Long term (current) use of aspirin: Secondary | ICD-10-CM | POA: Insufficient documentation

## 2014-12-19 DIAGNOSIS — Z8673 Personal history of transient ischemic attack (TIA), and cerebral infarction without residual deficits: Secondary | ICD-10-CM | POA: Diagnosis not present

## 2014-12-19 DIAGNOSIS — Y92410 Unspecified street and highway as the place of occurrence of the external cause: Secondary | ICD-10-CM | POA: Diagnosis not present

## 2014-12-19 DIAGNOSIS — E039 Hypothyroidism, unspecified: Secondary | ICD-10-CM | POA: Diagnosis not present

## 2014-12-19 DIAGNOSIS — Y9389 Activity, other specified: Secondary | ICD-10-CM | POA: Insufficient documentation

## 2014-12-19 DIAGNOSIS — S79922A Unspecified injury of left thigh, initial encounter: Secondary | ICD-10-CM | POA: Diagnosis present

## 2014-12-19 DIAGNOSIS — T148XXA Other injury of unspecified body region, initial encounter: Secondary | ICD-10-CM
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
## 2014-12-19 NOTE — Discharge Instructions (Signed)
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
## 2016-09-08 ENCOUNTER — Emergency Department (HOSPITAL_COMMUNITY): Payer: Medicare Other

## 2016-09-08 ENCOUNTER — Encounter (HOSPITAL_COMMUNITY): Payer: Self-pay | Admitting: Emergency Medicine

## 2016-09-08 ENCOUNTER — Emergency Department (HOSPITAL_COMMUNITY)
Admission: EM | Admit: 2016-09-08 | Discharge: 2016-09-08 | Disposition: A | Discharge status: Home / Self Care | Payer: Medicare Other | Attending: Emergency Medicine | Admitting: Emergency Medicine

## 2016-09-08 DIAGNOSIS — W01198A Fall on same level from slipping, tripping and stumbling with subsequent striking against other object, initial encounter: Secondary | ICD-10-CM | POA: Insufficient documentation

## 2016-09-08 DIAGNOSIS — Y929 Unspecified place or not applicable: Secondary | ICD-10-CM | POA: Insufficient documentation

## 2016-09-08 DIAGNOSIS — Z7982 Long term (current) use of aspirin: Secondary | ICD-10-CM | POA: Insufficient documentation

## 2016-09-08 DIAGNOSIS — Y939 Activity, unspecified: Secondary | ICD-10-CM | POA: Diagnosis not present

## 2016-09-08 DIAGNOSIS — Z79899 Other long term (current) drug therapy: Secondary | ICD-10-CM | POA: Diagnosis not present

## 2016-09-08 DIAGNOSIS — E039 Hypothyroidism, unspecified: Secondary | ICD-10-CM | POA: Insufficient documentation

## 2016-09-08 DIAGNOSIS — I10 Essential (primary) hypertension: Secondary | ICD-10-CM | POA: Diagnosis not present

## 2016-09-08 DIAGNOSIS — Y999 Unspecified external cause status: Secondary | ICD-10-CM | POA: Diagnosis not present

## 2016-09-08 DIAGNOSIS — S0101XA Laceration without foreign body of scalp, initial encounter: Secondary | ICD-10-CM | POA: Insufficient documentation

## 2016-09-08 DIAGNOSIS — W19XXXA Unspecified fall, initial encounter: Secondary | ICD-10-CM

## 2016-09-08 DIAGNOSIS — S0990XA Unspecified injury of head, initial encounter: Secondary | ICD-10-CM | POA: Diagnosis present

## 2016-09-08 MED ORDER — TETANUS-DIPHTH-ACELL PERTUSSIS 5-2.5-18.5 LF-MCG/0.5 IM SUSP
0.5000 mL | Freq: Once | INTRAMUSCULAR | Status: AC
  Administered 2016-09-08: 0.5 mL via INTRAMUSCULAR
  Filled 2016-09-08: qty 0.5

## 2016-09-08 NOTE — ED Provider Notes (Signed)
[facility] Provider Note   CSN: [id number] Arrival date & time: 09/08/16  1614     History   Chief Complaint Chief Complaint  Patient presents with  . Fall    HPI Leah Casey is a [age] y.o. female.  Patient states she tripped and hit the back of her head no loss of consciousness. She had her head on a dresser. Patient only complains of pain to the top of her head   The history is provided by the patient.  Fall  This is a new problem. The current episode started 6 to 12 hours ago. The problem has been resolved. Pertinent negatives include no chest pain, no abdominal pain and no headaches. Nothing aggravates the symptoms. Nothing relieves the symptoms.    Past Medical History:  Diagnosis Date  . Hyperlipidemia   . Hypertension   . Hypothyroidism   . Renal artery stenosis (HCC)   . TIA (transient ischemic attack)     Patient Active Problem List   Diagnosis Date Noted  . Orthostatic hypotension 07/14/2012  . Near syncope 07/13/2012  . Dehydration 07/13/2012  . Hyponatremia 07/13/2012  . Muscle cramps 07/13/2012  . Aortic aneurysm (HCC) 07/13/2012  . PFO (patent foramen ovale) 07/13/2012  . RENAL ARTERY STENOSIS 05/22/2009  . ELECTROCARDIOGRAM, ABNORMAL 05/22/2009  . HYPOTHYROIDISM 05/21/2009  . HYPERLIPIDEMIA, CONTROLLED 05/21/2009  . HYPERTENSION 05/21/2009  . TRANSIENT ISCHEMIC ATTACK 05/21/2009    Past Surgical History:  Procedure Laterality Date  . CATARACT EXTRACTION     Right    OB History    Gravida Para Term Preterm AB Living             3   SAB TAB Ectopic Multiple Live Births                   Home Medications    Prior to Admission medications   Medication Sig Start Date End Date Taking? Authorizing Provider  aspirin EC 81 MG tablet Take 81 mg by mouth every morning.    Historical Provider, MD  clopidogrel (PLAVIX) 75 MG tablet Take 75 mg by mouth every morning.     Historical Provider, MD  levothyroxine (SYNTHROID, LEVOTHROID)  125 MCG tablet Take 125 mcg by mouth every morning. *Takes 30 minutes to 1 hour before taking other medications    Historical Provider, MD  metoprolol (TOPROL-XL) 50 MG 24 hr tablet Take 50 mg by mouth every morning.     Historical Provider, MD  Olmesartan-Amlodipine-HCTZ (TRIBENZOR) 40-5-12.5 MG TABS Take 0.5 tablets by mouth every morning. 07/14/12   Kari BaarsEdward Hawkins, MD  Polyethyl Glycol-Propyl Glycol (SYSTANE OP) Apply 1 drop to eye daily as needed. For dry eye relief    Historical Provider, MD  pravastatin (PRAVACHOL) 40 MG tablet Take 40 mg by mouth every morning.     Historical Provider, MD    Family History Family History  Problem Relation Age of Onset  . Heart disease Mother   . Hypertension Mother     Social History Social History  Substance Use Topics  . Smoking status: Never Smoker  . Smokeless tobacco: Not on file  . Alcohol use No     Allergies   Sulfonamide derivatives   Review of Systems Review of Systems  Constitutional: Negative for appetite change and fatigue.  HENT: Negative for congestion, ear discharge and sinus pressure.        Bleeding to the top of her head  Eyes: Negative for discharge.  Respiratory: Negative  for cough.   Cardiovascular: Negative for chest pain.  Gastrointestinal: Negative for abdominal pain and diarrhea.  Genitourinary: Negative for frequency and hematuria.  Musculoskeletal: Negative for back pain.  Skin: Negative for rash.  Neurological: Negative for seizures and headaches.  Psychiatric/Behavioral: Negative for hallucinations.     Physical Exam Updated Vital Signs BP 165/58 (BP Location: Left Arm)   Pulse 81   Temp 98.4 F (36.9 C) (Oral)   Resp 16   Ht 5\' 1"  (1.549 m)   Wt 117 lb (53.1 kg)   SpO2 97%   BMI 22.11 kg/m   Physical Exam  Constitutional: She is oriented to person, place, and time. She appears well-developed.  HENT:  Head: Normocephalic.  Patient has a 3 cm laceration to the top of her head.  Eyes:  Conjunctivae and EOM are normal. No scleral icterus.  Neck: Neck supple. No thyromegaly present.  Cardiovascular: Normal rate and regular rhythm.  Exam reveals no gallop and no friction rub.   No murmur heard. Pulmonary/Chest: No stridor. She has no wheezes. She has no rales. She exhibits no tenderness.  Abdominal: She exhibits no distension. There is no tenderness. There is no rebound.  Musculoskeletal: Normal range of motion. She exhibits no edema.  Lymphadenopathy:    She has no cervical adenopathy.  Neurological: She is oriented to person, place, and time. She exhibits normal muscle tone. Coordination normal.  Skin: No rash noted. No erythema.  Psychiatric: She has a normal mood and affect. Her behavior is normal.     ED Treatments / Results  Labs (all labs ordered are listed, but only abnormal results are displayed) Labs Reviewed - No data to display  EKG  EKG Interpretation None       Radiology Ct Head Wo Contrast  Result Date: 09/08/2016 CLINICAL DATA:  Fall.  Head laceration. EXAM: CT HEAD WITHOUT CONTRAST CT CERVICAL SPINE WITHOUT CONTRAST TECHNIQUE: Multidetector CT imaging of the head and cervical spine was performed following the standard protocol without intravenous contrast. Multiplanar CT image reconstructions of the cervical spine were also generated. COMPARISON:  CT head 04/17/2009 FINDINGS: CT HEAD FINDINGS Brain: Generalized atrophy. Chronic microvascular ischemic changes in the white matter. Negative for intracranial hemorrhage. No acute infarct or mass. No shift of the midline structures. Vascular: No hyperdense vessel or unexpected calcification. Skull: Negative for skull fracture Sinuses/Orbits: Complete opacification of the left maxillary sinus. Remaining sinuses clear. Mastoid sinus clear. Normal orbits. Other: None CT CERVICAL SPINE FINDINGS Alignment: Mild anterior slip C3-4 and C4-5. 3 mm anterior slip C7-T1. Skull base and vertebrae: Negative for cervical  spine fracture Soft tissues and spinal canal: Negative Disc levels: Advanced disc degeneration and facet degeneration throughout the cervical spine including at C1-C2. Disc degeneration and spurring most severe at C5-6 and C6-7 causing spinal and foraminal stenosis. Diffuse facet degeneration. Upper chest: Mild scarring in the lung apices. Other: Bilateral carotid artery calcification. IMPRESSION: No acute intracranial abnormality. Advanced degenerative change in the cervical spine. Negative for fracture. Electronically Signed   By: Marlan Palau M.D.   On: 09/08/2016 17:00   Ct Cervical Spine Wo Contrast  Result Date: 09/08/2016 CLINICAL DATA:  Fall.  Head laceration. EXAM: CT HEAD WITHOUT CONTRAST CT CERVICAL SPINE WITHOUT CONTRAST TECHNIQUE: Multidetector CT imaging of the head and cervical spine was performed following the standard protocol without intravenous contrast. Multiplanar CT image reconstructions of the cervical spine were also generated. COMPARISON:  CT head 04/17/2009 FINDINGS: CT HEAD FINDINGS Brain: Generalized atrophy. Chronic  microvascular ischemic changes in the white matter. Negative for intracranial hemorrhage. No acute infarct or mass. No shift of the midline structures. Vascular: No hyperdense vessel or unexpected calcification. Skull: Negative for skull fracture Sinuses/Orbits: Complete opacification of the left maxillary sinus. Remaining sinuses clear. Mastoid sinus clear. Normal orbits. Other: None CT CERVICAL SPINE FINDINGS Alignment: Mild anterior slip C3-4 and C4-5. 3 mm anterior slip C7-T1. Skull base and vertebrae: Negative for cervical spine fracture Soft tissues and spinal canal: Negative Disc levels: Advanced disc degeneration and facet degeneration throughout the cervical spine including at C1-C2. Disc degeneration and spurring most severe at C5-6 and C6-7 causing spinal and foraminal stenosis. Diffuse facet degeneration. Upper chest: Mild scarring in the lung apices. Other:  Bilateral carotid artery calcification. IMPRESSION: No acute intracranial abnormality. Advanced degenerative change in the cervical spine. Negative for fracture. Electronically Signed   By: Marlan Palau M.D.   On: 09/08/2016 17:00    Procedures .Marland KitchenLaceration Repair Date/Time: 09/08/2016 7:23 PM Performed by: Bethann Berkshire Authorized by: Bethann Berkshire   Comments:     Patient had a 3 cm laceration to the top of her head. area was cleaned thoroughly with Betadine.  7 stables were used to close the laceration.  The patient tolerated the procedure well   (including critical care time)  Medications Ordered in ED Medications  Tdap (BOOSTRIX) injection 0.5 mL (not administered)     Initial Impression / Assessment and Plan / ED Course  I have reviewed the triage vital signs and the nursing notes.  Pertinent labs & imaging results that were available during my care of the patient were reviewed by me and considered in my medical decision making (see chart for details).     Patient had a fall with laceration to scalp. No loss of consciousness. 7 staples used to close laceration. Patient was instructed to follow-up with her PCP in 7-8 days to have staples removed  Final Clinical Impressions(s) / ED Diagnoses   Final diagnoses:  Fall, initial encounter    New Prescriptions New Prescriptions   No medications on file     Bethann Berkshire, MD 09/08/16 [id number]

## 2016-09-08 NOTE — Discharge Instructions (Signed)
Sutures out in 1 week.  Clean laceration once or twice a day with soap and water

## 2016-09-08 NOTE — ED Triage Notes (Addendum)
Pt reports tripping over feet today and fell into [place] cabinet, causing laceration to top of head.  Bleeding controlled, takes plavix. Pt alert and oriented.

## 2016-11-04 ENCOUNTER — Other Ambulatory Visit (HOSPITAL_COMMUNITY): Payer: Self-pay | Admitting: Pulmonary Disease

## 2016-11-04 DIAGNOSIS — E039 Hypothyroidism, unspecified: Secondary | ICD-10-CM

## 2016-11-09 ENCOUNTER — Ambulatory Visit (HOSPITAL_COMMUNITY)
Admission: RE | Admit: 2016-11-09 | Discharge: 2016-11-09 | Disposition: A | Discharge status: Home / Self Care | Payer: Medicare Other | Source: Ambulatory Visit | Attending: Pulmonary Disease | Admitting: Pulmonary Disease

## 2016-11-09 DIAGNOSIS — E039 Hypothyroidism, unspecified: Secondary | ICD-10-CM | POA: Diagnosis not present

## 2016-12-30 ENCOUNTER — Ambulatory Visit (INDEPENDENT_AMBULATORY_CARE_PROVIDER_SITE_OTHER): Payer: Medicare Other

## 2016-12-30 ENCOUNTER — Ambulatory Visit (INDEPENDENT_AMBULATORY_CARE_PROVIDER_SITE_OTHER): Payer: Medicare Other | Admitting: Orthopedic Surgery

## 2016-12-30 VITALS — BP 156/57 | HR 60 | Ht 62.0 in | Wt 109.0 lb

## 2016-12-30 DIAGNOSIS — M25561 Pain in right knee: Secondary | ICD-10-CM

## 2016-12-30 NOTE — Progress Notes (Signed)
NEW PATIENT OFFICE VISIT    Chief Complaint  Patient presents with  . Knee Pain    Right knee pain, no injury.    Leah Casey is 83 years old she presents with a 2 to three-week history of right knee pain which began after she wore some kneehighs to church. Since that time she is noted new onset dull aching constant pain over the medial and anteromedial joint line of her right knee. There are no mechanical symptoms  She is using a cane    Review of Systems  Constitutional: Negative for fever.  Respiratory: Negative for shortness of breath.   Cardiovascular: Negative for chest pain.  Genitourinary: Negative for frequency.     Past Medical History:  Diagnosis Date  . Hyperlipidemia   . Hypertension   . Hypothyroidism   . Renal artery stenosis (HCC)   . TIA (transient ischemic attack)     Past Surgical History:  Procedure Laterality Date  . CATARACT EXTRACTION     Right    Family History  Problem Relation Age of Onset  . Heart disease Mother   . Hypertension Mother    Social History  Substance Use Topics  . Smoking status: Never Smoker  . Smokeless tobacco: Not on file  . Alcohol use No    BP (!) 156/57   Pulse 60   Ht 5\' 2"  (1.575 m)   Wt 109 lb (49.4 kg)   BMI 19.94 kg/m   Physical Exam  Constitutional: She is oriented to person, place, and time. She appears well-developed and well-nourished.  Neurological: She is alert and oriented to person, place, and time.  Psychiatric: She has a normal mood and affect.  Vitals reviewed.   Right Knee Exam   Tenderness  The patient is experiencing tenderness in the medial joint line.  Range of Motion  Extension:  -5 normal  Flexion: normal Right knee flexion: 125.   Muscle Strength   The patient has normal right knee strength.  Tests  McMurray:  Medial - negative Lateral - negative Drawer:       Anterior - negative    Posterior - negative Varus: negative Valgus: negative  Other  Erythema:  absent Scars: absent Sensation: normal Pulse: present Swelling: none   Left Knee Exam   Tenderness  The patient is experiencing no tenderness.     Range of Motion  Extension:  -5 normal  Flexion:  120 normal   Muscle Strength   The patient has normal left knee strength.  Tests  McMurray:  Medial - negative Lateral - negative Drawer:       Anterior - negative     Posterior - negative Varus: negative Valgus: negative  Other  Erythema: absent Scars: absent Sensation: normal Pulse: present Swelling: none     She she is walking with a cane partial to full weightbearing No orders of the defined types were placed in this encounter.   Encounter Diagnosis  Name Primary?  . Acute pain of right knee Yes     PLAN:   X-ray show symmetric joint space narrowing tibiofemoral and excessive or severe patellofemoral arthritis  Procedure note right knee injection verbal consent was obtained to inject right knee joint  Timeout was completed to confirm the site of injection  The medications used were 40 mg of Depo-Medrol and 1% lidocaine 3 cc  Anesthesia was provided by ethyl chloride and the skin was prepped with alcohol.  After cleaning the skin with alcohol a 20-gauge needle  was used to inject the right knee joint. There were no complications. A sterile bandage was applied.  Follow-up as needed

## 2016-12-30 NOTE — Patient Instructions (Signed)

## 2017-01-02 ENCOUNTER — Telehealth: Payer: Self-pay | Admitting: Orthopedic Surgery

## 2017-01-02 NOTE — Telephone Encounter (Signed)
Patient's daughter came by asking if Dr. Romeo AppleHarrison would do an order for her mom to get a walker at [facility]. She stated that she stopped by there and was told if our office would fax an order, they would be glad to help. She also stated that her mom's insurance would pay for it.  Please advise

## 2017-01-02 NOTE — Telephone Encounter (Signed)
DONE

## 2017-03-30 ENCOUNTER — Emergency Department (HOSPITAL_COMMUNITY)
Admission: EM | Admit: 2017-03-30 | Discharge: 2017-03-30 | Disposition: A | Discharge status: Home / Self Care | Payer: Medicare Other | Attending: Emergency Medicine | Admitting: Emergency Medicine

## 2017-03-30 ENCOUNTER — Emergency Department (HOSPITAL_COMMUNITY): Payer: Medicare Other

## 2017-03-30 ENCOUNTER — Encounter (HOSPITAL_COMMUNITY): Payer: Self-pay | Admitting: Emergency Medicine

## 2017-03-30 DIAGNOSIS — S7012XA Contusion of left thigh, initial encounter: Secondary | ICD-10-CM | POA: Diagnosis not present

## 2017-03-30 DIAGNOSIS — Y999 Unspecified external cause status: Secondary | ICD-10-CM | POA: Diagnosis not present

## 2017-03-30 DIAGNOSIS — S0990XA Unspecified injury of head, initial encounter: Secondary | ICD-10-CM | POA: Diagnosis present

## 2017-03-30 DIAGNOSIS — Z79899 Other long term (current) drug therapy: Secondary | ICD-10-CM | POA: Diagnosis not present

## 2017-03-30 DIAGNOSIS — W19XXXA Unspecified fall, initial encounter: Secondary | ICD-10-CM

## 2017-03-30 DIAGNOSIS — Z7902 Long term (current) use of antithrombotics/antiplatelets: Secondary | ICD-10-CM | POA: Diagnosis not present

## 2017-03-30 DIAGNOSIS — Z7982 Long term (current) use of aspirin: Secondary | ICD-10-CM | POA: Diagnosis not present

## 2017-03-30 DIAGNOSIS — Y9301 Activity, walking, marching and hiking: Secondary | ICD-10-CM | POA: Diagnosis not present

## 2017-03-30 DIAGNOSIS — S51812A Laceration without foreign body of left forearm, initial encounter: Secondary | ICD-10-CM

## 2017-03-30 DIAGNOSIS — E039 Hypothyroidism, unspecified: Secondary | ICD-10-CM | POA: Insufficient documentation

## 2017-03-30 DIAGNOSIS — W01198A Fall on same level from slipping, tripping and stumbling with subsequent striking against other object, initial encounter: Secondary | ICD-10-CM | POA: Diagnosis not present

## 2017-03-30 DIAGNOSIS — I10 Essential (primary) hypertension: Secondary | ICD-10-CM | POA: Insufficient documentation

## 2017-03-30 DIAGNOSIS — Y929 Unspecified place or not applicable: Secondary | ICD-10-CM | POA: Diagnosis not present

## 2017-03-30 DIAGNOSIS — S0083XA Contusion of other part of head, initial encounter: Secondary | ICD-10-CM | POA: Diagnosis not present

## 2017-03-30 DIAGNOSIS — S51802A Unspecified open wound of left forearm, initial encounter: Secondary | ICD-10-CM | POA: Diagnosis not present

## 2017-03-30 MED ORDER — ONDANSETRON 8 MG PO TBDP
8.0000 mg | ORAL_TABLET | Freq: Once | ORAL | Status: AC
  Administered 2017-03-30: 8 mg via ORAL
  Filled 2017-03-30: qty 1

## 2017-03-30 MED ORDER — ONDANSETRON 8 MG PO TBDP: ORAL_TABLET | ORAL | 0 refills | Status: AC

## 2017-03-30 NOTE — ED Provider Notes (Signed)
[facility] Provider Note   CSN: [id number] Arrival date & time: 03/30/17  1309     History   Chief Complaint Chief Complaint  Patient presents with  . Fall    HPI Leah Casey is a 83 y.o. female.  Patient is a 83 year old female with history of HTN, TIA presenting for evaluation of a fall. She was walking with her caretaker when she lost her balance and fell forward striking her head on the concrete. There was no loss of consciousness, however she feel "stunned". She reports headache and abrasions and swelling to the left upper forehead. No neck pain.   The history is provided by the patient.  Fall  This is a new problem. The current episode started less than 1 hour ago. The problem occurs constantly. The problem has not changed since onset.Associated symptoms include headaches. Pertinent negatives include no chest pain. Nothing aggravates the symptoms. Nothing relieves the symptoms. She has tried nothing for the symptoms.    Past Medical History:  Diagnosis Date  . Hyperlipidemia   . Hypertension   . Hypothyroidism   . Renal artery stenosis (HCC)   . TIA (transient ischemic attack)     Patient Active Problem List   Diagnosis Date Noted  . Orthostatic hypotension 07/14/2012  . Near syncope 07/13/2012  . Dehydration 07/13/2012  . Hyponatremia 07/13/2012  . Muscle cramps 07/13/2012  . Aortic aneurysm (HCC) 07/13/2012  . PFO (patent foramen ovale) 07/13/2012  . RENAL ARTERY STENOSIS 05/22/2009  . ELECTROCARDIOGRAM, ABNORMAL 05/22/2009  . HYPOTHYROIDISM 05/21/2009  . HYPERLIPIDEMIA, CONTROLLED 05/21/2009  . HYPERTENSION 05/21/2009  . TRANSIENT ISCHEMIC ATTACK 05/21/2009    Past Surgical History:  Procedure Laterality Date  . CATARACT EXTRACTION     Right    OB History    Gravida Para Term Preterm AB Living             3   SAB TAB Ectopic Multiple Live Births                   Home Medications    Prior to Admission medications   Medication  Sig Start Date End Date Taking? Authorizing Provider  aspirin EC 81 MG tablet Take 81 mg by mouth every morning.    [provider]  clopidogrel (PLAVIX) 75 MG tablet Take 75 mg by mouth every morning.     [provider]  levothyroxine (SYNTHROID, LEVOTHROID) 125 MCG tablet Take 125 mcg by mouth every morning. *Takes 30 minutes to 1 hour before taking other medications    [provider]  metoprolol (TOPROL-XL) 50 MG 24 hr tablet Take 50 mg by mouth every morning.     [provider]  Olmesartan-Amlodipine-HCTZ (TRIBENZOR) 40-5-12.5 MG TABS Take 0.5 tablets by mouth every morning. 07/14/12   Kari Baars, MD  Polyethyl Glycol-Propyl Glycol (SYSTANE OP) Apply 1 drop to eye daily as needed. For dry eye relief    [provider]  pravastatin (PRAVACHOL) 40 MG tablet Take 40 mg by mouth every morning.     [provider]    Family History Family History  Problem Relation Age of Onset  . Heart disease Mother   . Hypertension Mother     Social History Social History  Substance Use Topics  . Smoking status: Never Smoker  . Smokeless tobacco: Never Used  . Alcohol use No     Allergies   Sulfonamide derivatives   Review of Systems Review of Systems  Cardiovascular: Negative for chest pain.  Neurological: Positive for headaches.  All other systems reviewed and are negative.    Physical Exam Updated Vital Signs Pulse 60   Temp 98.5 F (36.9 C)   Resp 18   Ht 5\' 3"  (1.6 m)   Wt 48.1 kg (106 lb)   SpO2 98%   BMI 18.78 kg/m   Physical Exam  Constitutional: She is oriented to person, place, and time. She appears well-developed and well-nourished. No distress.  HENT:  Head: Normocephalic.  There is a moderate-sized hematoma and superficial abrasions to the left upper forehead. There is no hemotympanum.    Eyes: Pupils are equal, round, and reactive to light. EOM are normal.  Neck: Normal range of motion. Neck supple.    There is no cervical spine ttp or stepoffs. Full ROM without limitation.  Cardiovascular: Normal rate and regular rhythm.  Exam reveals no gallop and no friction rub.   No murmur heard. Pulmonary/Chest: Effort normal and breath sounds normal. No respiratory distress. She has no wheezes.  Abdominal: Soft. Bowel sounds are normal. She exhibits no distension. There is no tenderness.  Musculoskeletal: Normal range of motion.  Neurological: She is alert and oriented to person, place, and time. No cranial nerve deficit. She exhibits normal muscle tone. Coordination normal.  Skin: Skin is warm and dry. She is not diaphoretic.  Nursing note and vitals reviewed.    ED Treatments / Results  Labs (all labs ordered are listed, but only abnormal results are displayed) Labs Reviewed - No data to display  EKG  EKG Interpretation None       Radiology No results found.  Procedures Procedures (including critical care time)  Medications Ordered in ED Medications - No data to display   Initial Impression / Assessment and Plan / ED Course  I have reviewed the triage vital signs and the nursing notes.  Pertinent labs & imaging results that were available during my care of the patient were reviewed by me and considered in my medical decision making (see chart for details).  Patient presents after a fall during which she struck her head on concrete. There was no loss of consciousness and she is neurologically intact. She does feel somewhat nauseated, however CT scan is negative. She was given Zofran and now feels back to normal. She also has a skin tear to the left arm which was dressed and complaining of pain in her thigh with negative femur x-rays. She will be discharged with Zofran and as needed return.  Final Clinical Impressions(s) / ED Diagnoses   Final diagnoses:  None    New Prescriptions New Prescriptions   No medications on file     Geoffery Lyonselo, Javen Hinderliter, MD 03/30/17 [phone]

## 2017-03-30 NOTE — ED Triage Notes (Signed)
Pt tripped and fell onto left side x 30 mins ago. Abrasion and hematoma to left forehead, skin tear to left elbow noted. Pt denies pain and loc. Reports some nausea.

## 2017-03-30 NOTE — ED Notes (Signed)
Patient transported to X-ray 

## 2017-03-30 NOTE — Discharge Instructions (Signed)
Zofran as prescribed as needed for nausea.  Apply ice 20 minutes every 2 hours while awake for the next 2 days.  Tylenol 650 mg every 6 hours as needed for pain.  Local wound care with bacitracin and dressing changes once or twice daily.  Return to the emergency department for worsening headache, difficulty waking, unsteady gait, seizure activity, or other new and concerning symptoms.

## 2018-04-19 ENCOUNTER — Other Ambulatory Visit (HOSPITAL_COMMUNITY): Payer: Self-pay | Admitting: Pulmonary Disease

## 2018-04-19 DIAGNOSIS — R1310 Dysphagia, unspecified: Secondary | ICD-10-CM

## 2018-04-24 ENCOUNTER — Ambulatory Visit (HOSPITAL_COMMUNITY)
Admission: RE | Admit: 2018-04-24 | Discharge: 2018-04-24 | Disposition: A | Discharge status: Home / Self Care | Payer: Medicare Other | Source: Ambulatory Visit | Attending: Pulmonary Disease | Admitting: Pulmonary Disease

## 2018-04-24 DIAGNOSIS — R1310 Dysphagia, unspecified: Secondary | ICD-10-CM | POA: Insufficient documentation

## 2019-01-31 ENCOUNTER — Other Ambulatory Visit: Payer: Self-pay

## 2019-01-31 ENCOUNTER — Ambulatory Visit (HOSPITAL_COMMUNITY)
Admission: RE | Admit: 2019-01-31 | Discharge: 2019-01-31 | Disposition: A | Discharge status: Home / Self Care | Payer: Medicare Other | Source: Ambulatory Visit | Attending: Pulmonary Disease | Admitting: Pulmonary Disease

## 2019-01-31 ENCOUNTER — Other Ambulatory Visit (HOSPITAL_COMMUNITY): Payer: Self-pay | Admitting: Pulmonary Disease

## 2019-01-31 DIAGNOSIS — R102 Pelvic and perineal pain: Secondary | ICD-10-CM | POA: Insufficient documentation

## 2019-02-15 ENCOUNTER — Emergency Department (HOSPITAL_COMMUNITY): Payer: Medicare Other

## 2019-02-15 ENCOUNTER — Other Ambulatory Visit: Payer: Self-pay

## 2019-02-15 ENCOUNTER — Encounter (HOSPITAL_COMMUNITY): Payer: Self-pay | Admitting: Emergency Medicine

## 2019-02-15 ENCOUNTER — Emergency Department (HOSPITAL_COMMUNITY)
Admission: EM | Admit: 2019-02-15 | Discharge: 2019-02-15 | Disposition: A | Discharge status: Home / Self Care | Payer: Medicare Other | Attending: Emergency Medicine | Admitting: Emergency Medicine

## 2019-02-15 DIAGNOSIS — Z7982 Long term (current) use of aspirin: Secondary | ICD-10-CM | POA: Diagnosis not present

## 2019-02-15 DIAGNOSIS — I10 Essential (primary) hypertension: Secondary | ICD-10-CM | POA: Diagnosis not present

## 2019-02-15 DIAGNOSIS — Z79899 Other long term (current) drug therapy: Secondary | ICD-10-CM | POA: Insufficient documentation

## 2019-02-15 DIAGNOSIS — W0110XA Fall on same level from slipping, tripping and stumbling with subsequent striking against unspecified object, initial encounter: Secondary | ICD-10-CM | POA: Diagnosis not present

## 2019-02-15 DIAGNOSIS — S42291A Other displaced fracture of upper end of right humerus, initial encounter for closed fracture: Secondary | ICD-10-CM

## 2019-02-15 DIAGNOSIS — Y92017 Garden or yard in single-family (private) house as the place of occurrence of the external cause: Secondary | ICD-10-CM | POA: Diagnosis not present

## 2019-02-15 DIAGNOSIS — Z8673 Personal history of transient ischemic attack (TIA), and cerebral infarction without residual deficits: Secondary | ICD-10-CM | POA: Insufficient documentation

## 2019-02-15 DIAGNOSIS — Y93H2 Activity, gardening and landscaping: Secondary | ICD-10-CM | POA: Diagnosis not present

## 2019-02-15 DIAGNOSIS — S42201A Unspecified fracture of upper end of right humerus, initial encounter for closed fracture: Secondary | ICD-10-CM | POA: Insufficient documentation

## 2019-02-15 DIAGNOSIS — S51811A Laceration without foreign body of right forearm, initial encounter: Secondary | ICD-10-CM | POA: Diagnosis not present

## 2019-02-15 DIAGNOSIS — S4991XA Unspecified injury of right shoulder and upper arm, initial encounter: Secondary | ICD-10-CM | POA: Diagnosis present

## 2019-02-15 DIAGNOSIS — Y998 Other external cause status: Secondary | ICD-10-CM | POA: Insufficient documentation

## 2019-02-15 DIAGNOSIS — Z7902 Long term (current) use of antithrombotics/antiplatelets: Secondary | ICD-10-CM | POA: Diagnosis not present

## 2019-02-15 DIAGNOSIS — E039 Hypothyroidism, unspecified: Secondary | ICD-10-CM | POA: Diagnosis not present

## 2019-02-15 MED ORDER — LIDOCAINE HCL (PF) 1 % IJ SOLN
5.0000 mL | Freq: Once | INTRAMUSCULAR | Status: AC
  Administered 2019-02-15: 21:00:00 5 mL via INTRADERMAL
  Filled 2019-02-15: qty 6

## 2019-02-15 MED ORDER — TRAMADOL-ACETAMINOPHEN 37.5-325 MG PO TABS: 1.0000 | ORAL_TABLET | Freq: Four times a day (QID) | ORAL | 0 refills | Status: DC | PRN

## 2019-02-15 MED ORDER — POVIDONE-IODINE 10 % EX SOLN
CUTANEOUS | Status: AC
  Filled 2019-02-15: qty 15

## 2019-02-15 MED ORDER — TRAMADOL-ACETAMINOPHEN 37.5-325 MG PO TABS
1.0000 | ORAL_TABLET | Freq: Once | ORAL | Status: AC
  Administered 2019-02-15: 1 via ORAL
  Filled 2019-02-15: qty 1

## 2019-02-15 NOTE — Discharge Instructions (Signed)
The testing indicates that you have a broken humerus, but should heal with time and staying in an immobilizer.  Keep it on until you see the orthopedic doctor in 5 days or so.  Use ice on the sore area where it is broken, 3 or 4 times a day for 30 minutes.  If you have difficulty with the injury, or have other concerns please return here for a checkup.  Wound on your right forearm should be cleansed with soap and water daily, and apply a bandage, until it heals.  The sutures need to be removed in about 10 days.  Your PCP, or Dr. Aline Brochure, can do that.

## 2019-02-15 NOTE — ED Triage Notes (Signed)
Per EMS patient from home; called out due to fall outside. Patient denies LOC but complains of right shoulder pain and is unable to move shoulder. Patient also has skin tear to right forearm.

## 2019-02-15 NOTE — ED Provider Notes (Signed)
[facility]ERGENCY DEPARTMENT Provider Note   CSN: [id number] Arrival date & time: 02/15/19  1843    History   Chief Complaint Chief Complaint  Patient presents with  . Fall    HPI Leah Casey is a [age] y.o. female.     HPI   Patient states she injured her right shoulder when she was leaning over to cut some weeds, with clippers.  Feels like she "got overbalanced."  She complains of isolated injury to her right shoulder.  She was able to get up with help and came here by EMS.  She states adamantly that she does not have any other injuries and no other complaints at this time.  She did not have a prodrome, and there was no loss of consciousness.  She denies headache, neck pain, back pain, chest pain, shortness of breath, nausea, vomiting, weakness or dizziness.  There are no other known modifying factors.  Past Medical History:  Diagnosis Date  . Hyperlipidemia   . Hypertension   . Hypothyroidism   . Renal artery stenosis (HCC)   . TIA (transient ischemic attack)     Patient Active Problem List   Diagnosis Date Noted  . Orthostatic hypotension 07/14/2012  . Near syncope 07/13/2012  . Dehydration 07/13/2012  . Hyponatremia 07/13/2012  . Muscle cramps 07/13/2012  . Aortic aneurysm (HCC) 07/13/2012  . PFO (patent foramen ovale) 07/13/2012  . RENAL ARTERY STENOSIS 05/22/2009  . ELECTROCARDIOGRAM, ABNORMAL 05/22/2009  . HYPOTHYROIDISM 05/21/2009  . HYPERLIPIDEMIA, CONTROLLED 05/21/2009  . HYPERTENSION 05/21/2009  . TRANSIENT ISCHEMIC ATTACK 05/21/2009    Past Surgical History:  Procedure Laterality Date  . CATARACT EXTRACTION     Right     OB History    Gravida      Para      Term      Preterm      AB      Living  3     SAB      TAB      Ectopic      Multiple      Live Births               Home Medications    Prior to Admission medications   Medication Sig Start Date End Date Taking? Authorizing Provider  aspirin EC 81 MG tablet  Take 81 mg by mouth every morning.   Yes [provider]  clopidogrel (PLAVIX) 75 MG tablet Take 75 mg by mouth every morning.    Yes [provider]  levothyroxine (SYNTHROID) 100 MCG tablet Take 100 mcg by mouth every morning. *Takes 30 minutes to 1 hour before taking other medications    Yes [provider]  metoprolol (TOPROL-XL) 50 MG 24 hr tablet Take 50 mg by mouth every morning.    Yes [provider]  Olmesartan-Amlodipine-HCTZ (TRIBENZOR) 40-5-12.5 MG TABS Take 0.5 tablets by mouth every morning. 07/14/12  Yes Kari BaarsHawkins, Edward, MD  ondansetron (ZOFRAN ODT) 8 MG disintegrating tablet 8mg  ODT q4 hours prn nausea Patient taking differently: Take 8 mg by mouth every 8 (eight) hours as needed. 8mg  ODT q4 hours prn nausea 03/30/17  Yes Delo, Riley Lamouglas, MD  Polyethyl Glycol-Propyl Glycol (SYSTANE OP) Apply 1 drop to eye daily as needed. For dry eye relief   Yes [provider]  pravastatin (PRAVACHOL) 40 MG tablet Take 40 mg by mouth every morning.    Yes [provider]  traMADol-acetaminophen (ULTRACET) 37.5-325 MG tablet Take 1 tablet  by mouth every 6 (six) hours as needed for moderate pain. 02/15/19   Mancel BaleWentz, Quintina Hakeem, MD    Family History Family History  Problem Relation Age of Onset  . Heart disease Mother   . Hypertension Mother     Social History Social History   Tobacco Use  . Smoking status: Never Smoker  . Smokeless tobacco: Never Used  Substance Use Topics  . Alcohol use: No  . Drug use: No     Allergies   Sulfonamide derivatives   Review of Systems Review of Systems  All other systems reviewed and are negative.    Physical Exam Updated Vital Signs BP (!) 181/76 (BP Location: Left Arm)   Pulse 67   Temp 97.8 F (36.6 C) (Oral)   Resp 16   Ht 5\' 1"  (1.549 m)   Wt 48 kg   SpO2 98%   BMI 19.99 kg/m   Physical Exam Vitals signs and nursing note reviewed.  Constitutional:      General: She is not in acute  distress.    Appearance: She is well-developed and normal weight. She is not ill-appearing, toxic-appearing or diaphoretic.  HENT:     Head: Normocephalic and atraumatic.     Right Ear: External ear normal.     Left Ear: External ear normal.     Nose: No congestion.     Mouth/Throat:     Pharynx: No oropharyngeal exudate or posterior oropharyngeal erythema.  Eyes:     Conjunctiva/sclera: Conjunctivae normal.     Pupils: Pupils are equal, round, and reactive to light.  Neck:     Musculoskeletal: Normal range of motion and neck supple.     Trachea: Phonation normal.  Cardiovascular:     Rate and Rhythm: Normal rate and regular rhythm.     Heart sounds: Normal heart sounds.  Pulmonary:     Effort: Pulmonary effort is normal.     Breath sounds: Normal breath sounds.  Abdominal:     Palpations: Abdomen is soft.     Tenderness: There is no abdominal tenderness.  Musculoskeletal:     Comments: Guards against movement of the right shoulder secondary to pain.  There is no significant visualized deformity of the right shoulder.  There is normal range of motion of the left arm and the right leg.  Left leg is somewhat decreased movement however she is able to elevate the leg off the stretcher and hold there.  There is a small abrasion of the right anterior knee which is not bleeding.  Skin:    General: Skin is warm and dry.     Comments: Right mid dorsal forearm with a gaping laceration, 5 cm.  Wound is tangential through the skin surface.  Wound is irregular.  Neurovascular intact distally in the right hand.  Neurological:     Mental Status: She is alert and oriented to person, place, and time.     Cranial Nerves: No cranial nerve deficit.     Sensory: No sensory deficit.     Motor: No abnormal muscle tone.     Coordination: Coordination normal.  Psychiatric:        Mood and Affect: Mood normal.        Behavior: Behavior normal.        Thought Content: Thought content normal.         Judgment: Judgment normal.      ED Treatments / Results  Labs (all labs ordered are listed, but only abnormal results are  displayed) Labs Reviewed - No data to display  EKG None  Radiology Dg Shoulder Right  Result Date: 02/15/2019 CLINICAL DATA:  Right shoulder pain after fall. EXAM: RIGHT SHOULDER - 2+ VIEW COMPARISON:  Radiographs of January 31, 2009. FINDINGS: Moderately displaced fracture is seen involving the proximal right humeral head. No dislocation is noted. Degenerative changes seen involving the right acromioclavicular joint. Visualized ribs are unremarkable. IMPRESSION: Moderately displaced proximal right humeral head fracture. Electronically Signed   By: Marijo Conception M.D.   On: 02/15/2019 19:59    Procedures .Marland KitchenLaceration Repair  Date/Time: 02/15/2019 8:45 PM Performed by: Daleen Bo, MD Authorized by: Daleen Bo, MD   Consent:    Consent obtained:  Verbal   Consent given by:  Patient   Risks discussed:  Infection and pain   Alternatives discussed:  No treatment Anesthesia (see MAR for exact dosages):    Anesthesia method:  Local infiltration   Local anesthetic:  Lidocaine 1% w/o epi Laceration details:    Location: Right forearm.   Length (cm):  5   Depth (mm):  9 Repair type:    Repair type:  Intermediate Pre-procedure details:    Preparation:  Patient was prepped and draped in usual sterile fashion Exploration:    Hemostasis achieved with:  Direct pressure   Wound exploration: wound explored through full range of motion and entire depth of wound probed and visualized     Wound extent: no areolar tissue violation noted, no fascia violation noted, no foreign bodies/material noted, no nerve damage noted and no tendon damage noted     Contaminated: no   Treatment:    Area cleansed with:  Betadine   Amount of cleaning:  Standard   Irrigation solution:  Sterile saline   Visualized foreign bodies/material removed: no   Skin repair:    Repair method:   Sutures   Suture size:  3-0   Suture material:  Prolene   Number of sutures:  5 Post-procedure details:    Dressing:  Non-adherent dressing and bulky dressing   Patient tolerance of procedure:  Tolerated well, no immediate complications   (including critical care time)  Medications Ordered in ED Medications  lidocaine (PF) (XYLOCAINE) 1 % injection 5 mL (has no administration in time range)  povidone-iodine (BETADINE) 10 % external solution (has no administration in time range)  traMADol-acetaminophen (ULTRACET) 37.5-325 MG per tablet 1 tablet (has no administration in time range)     Initial Impression / Assessment and Plan / ED Course  I have reviewed the triage vital signs and the nursing notes.  Pertinent labs & imaging results that were available during my care of the patient were reviewed by me and considered in my medical decision making (see chart for details).         Patient Vitals for the past 24 hrs:  BP Temp Temp src Pulse Resp SpO2 Height Weight  02/15/19 1853 (!) 181/76 97.8 F (36.6 C) Oral 67 16 98 % - -  02/15/19 1852 - - - - - - 5\' 1"  (1.549 m) 48 kg    8:55 PM Reevaluation with update and discussion. After initial assessment and treatment, an updated evaluation reveals she is fairly comfortable this time, findings have been discussed with her daughter by telephone and the patient.  All questions answered. Daleen Bo   Medical Decision Making: Fall, mechanical with fracture proximal humerus and laceration right forearm.  No visceral injury, hip injury, neck or back injury.  CRITICAL CARE-no  Performed by: Mancel BaleElliott Jackolyn Geron  Nursing Notes Reviewed/ Care Coordinated Applicable Imaging Reviewed Interpretation of Laboratory Data incorporated into ED treatment  The patient appears reasonably screened and/or stabilized for discharge and I doubt any other medical condition or other [facility] requiring further screening, evaluation, or treatment in the ED at this time  prior to discharge.  Plan: Home Medications-continue usual medicines, use Tylenol for pain.; Home Treatments-shoulder immobilizer 24/7 until seen orthopedics; return here if the recommended treatment, does not improve the symptoms; Recommended follow up-orthopedics 3 to 5 days, PCP 10 days for suture removal.   Final Clinical Impressions(s) / ED Diagnoses   Final diagnoses:  Humerus head fracture, right, closed, initial encounter  Laceration of right forearm, initial encounter    ED Discharge Orders         Ordered    traMADol-acetaminophen (ULTRACET) 37.5-325 MG tablet  Every 6 hours PRN     02/15/19 2055           Mancel BaleWentz, Jakeia Carreras, MD 02/15/19 2056

## 2019-02-18 ENCOUNTER — Telehealth: Payer: Self-pay | Admitting: Orthopedic Surgery

## 2019-02-18 NOTE — Telephone Encounter (Signed)
Spoke to pt's daughter Leah Casey, appointment given for this Friday, 02/22/19 at 10:20

## 2019-02-18 NOTE — Telephone Encounter (Signed)
fri or weds 130

## 2019-02-18 NOTE — Telephone Encounter (Signed)
Patient's daughter called in stating that an appointment was needed for her mother who has a moderately displaced proximal right humeral head fracture. They were told for her to get an appointment in 5 days.  Wednesday's schedule is full.  Would you please review pt's chart and advise when to schedule her?  Thanks

## 2019-02-22 ENCOUNTER — Other Ambulatory Visit: Payer: Self-pay

## 2019-02-22 ENCOUNTER — Encounter: Payer: Self-pay | Admitting: Orthopedic Surgery

## 2019-02-22 ENCOUNTER — Ambulatory Visit (INDEPENDENT_AMBULATORY_CARE_PROVIDER_SITE_OTHER): Payer: Medicare Other | Admitting: Orthopedic Surgery

## 2019-02-22 VITALS — BP 148/59 | HR 63 | Temp 98.1°F | Ht 61.0 in | Wt 105.0 lb

## 2019-02-22 DIAGNOSIS — S42291A Other displaced fracture of upper end of right humerus, initial encounter for closed fracture: Secondary | ICD-10-CM

## 2019-02-22 NOTE — Progress Notes (Signed)
Patient ID: Leah Casey, female   DOB: 03-Aug-1914, 85 y.o.   MRN: [id number]  Chief Complaint  Patient presents with  . Fracture    Rt shoulder DOI 02/15/19    HPI Leah Casey is a 85 y.o. female.   She fell and injured her right shoulder.  She complains of pain over the right shoulder 7 days duration constant improving worse with movement   Review of Systems Review of Systems  Musculoskeletal: Positive for arthralgias, back pain and gait problem.  Hematological: Bruises/bleeds easily.     Past Medical History:  Diagnosis Date  . Hyperlipidemia   . Hypertension   . Hypothyroidism   . Renal artery stenosis ([place])   . TIA (transient ischemic attack)     Past Surgical History:  Procedure Laterality Date  . CATARACT EXTRACTION     Right    Family History  Problem Relation Age of Onset  . Heart disease Mother   . Hypertension Mother     Social History Social History   Tobacco Use  . Smoking status: Never Smoker  . Smokeless tobacco: Never Used  Substance Use Topics  . Alcohol use: No  . Drug use: No    Allergies  Allergen Reactions  . Sulfonamide Derivatives     Current Outpatient Medications  Medication Sig Dispense Refill  . aspirin EC 81 MG tablet Take 81 mg by mouth every morning.    . clopidogrel (PLAVIX) 75 MG tablet Take 75 mg by mouth every morning.     Marland Kitchen levothyroxine (SYNTHROID) 100 MCG tablet Take 100 mcg by mouth every morning. *Takes 30 minutes to 1 hour before taking other medications     . metoprolol (TOPROL-XL) 50 MG 24 hr tablet Take 50 mg by mouth every morning.     . Olmesartan-Amlodipine-HCTZ (TRIBENZOR) 40-5-12.5 MG TABS Take 0.5 tablets by mouth every morning. 30 tablet 12  . ondansetron (ZOFRAN ODT) 8 MG disintegrating tablet 8mg  ODT q4 hours prn nausea (Patient taking differently: Take 8 mg by mouth every 8 (eight) hours as needed. 8mg  ODT q4 hours prn nausea) 6 tablet 0  . Polyethyl Glycol-Propyl Glycol (SYSTANE OP) Apply 1 drop  to eye daily as needed. For dry eye relief    . pravastatin (PRAVACHOL) 40 MG tablet Take 40 mg by mouth every morning.     . traMADol-acetaminophen (ULTRACET) 37.5-325 MG tablet Take 1 tablet by mouth every 6 (six) hours as needed for moderate pain. 20 tablet 0   No current facility-administered medications for this visit.     Physical Exam Blood pressure (!) 148/59, pulse 63, height 5\' 1"  (1.549 m), weight 105 lb (47.6 kg). Physical Exam The patient is well developed well nourished and well groomed.  Orientation to person place and time is normal  Mood is pleasant.  Ambulatory status today wheelchair normally independent ambulator Cervical spine exam is as follows nontender  Right shoulder  Examination: Inspection of the shoulder shows that there is ecchymosis of the skin proximal humerus down into the upper arm. Tenderness at the proximal humerus and fracture site is noted.  Range of motion examination is deferred because of pain.  Motor exam hand wrist elbow normal including normal muscle tone.  Shoulder stability tests deferred because of fracture, elbow stable wrist stable.  Neurovascular examination is intact and the lymph nodes in the axilla and supraclavicular regions are normal   The opposite shoulder has no swelling, normal range of motion, no joint contracture subluxation atrophy tremor  or skin lesion. Neurovascular exam is intact.   MEDICAL DECISION SECTION  X-rays were done at the hospital  My independent reading of xrays: Proximal humerus fracture, comminuted appears to have a greater tuberosity component to it, looks to be 100% displaced  Assessment    Right proximal humerus fracture   CERTAINLY NOT A SURGICAL CANDIDATE AT 85 YRS OLD DESPITE FRACTURE POSITION  Plan    Sling-and-swathe for 3 weeks followed by x-ray of the shoulder  No orders of the defined types were placed in this encounter.

## 2019-02-28 ENCOUNTER — Telehealth: Payer: Self-pay | Admitting: Orthopedic Surgery

## 2019-02-28 NOTE — Telephone Encounter (Signed)
DR Aline Brochure likes to separate, you may need to ask him, but likely he wants to see separately, thanks

## 2019-02-28 NOTE — Telephone Encounter (Signed)
Call from patient's daughter, power of attorney, Eulogio Bear, [phone], called to relay that patient states she is having pain in right leg, also from the recent fall in which she fractured her shoulder (initially seen in office 02/22/19 by Dr Aline Brochure for shoulder/humerus). States that home health nurses have noted that patient may need to be evaluated. Please advise if separate appointment is needed or if we can check at time of next appointment for shoulder, 03/18/19.

## 2019-02-28 NOTE — Telephone Encounter (Signed)
Forwarding to Dr Aline Brochure, per notes, to advise if separate appointment for right leg pain problem, or evaluate at time of scheduled appointment for shoulder fracture care?

## 2019-03-02 ENCOUNTER — Other Ambulatory Visit: Payer: Self-pay

## 2019-03-02 ENCOUNTER — Encounter (HOSPITAL_COMMUNITY): Payer: Self-pay | Admitting: Emergency Medicine

## 2019-03-02 ENCOUNTER — Emergency Department (HOSPITAL_COMMUNITY): Payer: Medicare Other

## 2019-03-02 ENCOUNTER — Emergency Department (HOSPITAL_COMMUNITY)
Admission: EM | Admit: 2019-03-02 | Discharge: 2019-03-02 | Disposition: A | Discharge status: Home / Self Care | Payer: Medicare Other | Attending: Emergency Medicine | Admitting: Emergency Medicine

## 2019-03-02 DIAGNOSIS — M5136 Other intervertebral disc degeneration, lumbar region: Secondary | ICD-10-CM | POA: Diagnosis not present

## 2019-03-02 DIAGNOSIS — M79604 Pain in right leg: Secondary | ICD-10-CM | POA: Diagnosis not present

## 2019-03-02 DIAGNOSIS — M79605 Pain in left leg: Secondary | ICD-10-CM | POA: Diagnosis present

## 2019-03-02 DIAGNOSIS — Z79899 Other long term (current) drug therapy: Secondary | ICD-10-CM | POA: Insufficient documentation

## 2019-03-02 DIAGNOSIS — E039 Hypothyroidism, unspecified: Secondary | ICD-10-CM | POA: Insufficient documentation

## 2019-03-02 DIAGNOSIS — Z7982 Long term (current) use of aspirin: Secondary | ICD-10-CM | POA: Insufficient documentation

## 2019-03-02 DIAGNOSIS — I10 Essential (primary) hypertension: Secondary | ICD-10-CM | POA: Insufficient documentation

## 2019-03-02 MED ORDER — ACETAMINOPHEN 325 MG PO TABS
650.0000 mg | ORAL_TABLET | Freq: Once | ORAL | Status: AC
  Administered 2019-03-02: 650 mg via ORAL
  Filled 2019-03-02: qty 2

## 2019-03-02 MED ORDER — DICLOFENAC SODIUM 1 % TD GEL: 4.0000 g | Freq: Four times a day (QID) | TRANSDERMAL | 1 refills | Status: AC

## 2019-03-02 NOTE — ED Triage Notes (Signed)
[facility] on July 3rd and seen here in ED with X-rays done on left arm.  But pt has been c/o right and left upper leg pain, pain shooting down both leg.  Rates pain 10/10.  Denies any other discomfort.

## 2019-03-02 NOTE — ED Notes (Signed)
Attempted to call pts daughter, no answer

## 2019-03-02 NOTE — ED Provider Notes (Signed)
[facility] EMERGENCY DEPARTMENT Provider Note   CSN: [id number] Arrival date & time: 03/02/19  1234     History   Chief Complaint Chief Complaint  Patient presents with  . Fall    July 3rd    HPI Leah Casey is a [age] y.o. female.     Patient presents with complaint of hip and upper leg pain, left greater than right, ongoing since she fell on 02/15/2019.  Patient was initially seen in the emergency department diagnosed with a right proximal humeral head fracture.  Patient is currently in a sling from this.  At that time, she was provided tramadol for pain which she did not tolerate well.  She complains of late afternoon to evening pain, worse overnight in her upper legs.  Pain shoots from her hips down into her legs.  At the current time the pain is worse on the left side.  She denies any back pain.  She states that she is able to walk.  She has been taking Tylenol at home for pain and Tylenol PM at night to help her sleep.  Onset of symptoms acute.  Course is waxing and waning.     Past Medical History:  Diagnosis Date  . Hyperlipidemia   . Hypertension   . Hypothyroidism   . Renal artery stenosis (HCC)   . TIA (transient ischemic attack)     Patient Active Problem List   Diagnosis Date Noted  . Orthostatic hypotension 07/14/2012  . Near syncope 07/13/2012  . Dehydration 07/13/2012  . Hyponatremia 07/13/2012  . Muscle cramps 07/13/2012  . Aortic aneurysm (HCC) 07/13/2012  . PFO (patent foramen ovale) 07/13/2012  . RENAL ARTERY STENOSIS 05/22/2009  . ELECTROCARDIOGRAM, ABNORMAL 05/22/2009  . HYPOTHYROIDISM 05/21/2009  . HYPERLIPIDEMIA, CONTROLLED 05/21/2009  . HYPERTENSION 05/21/2009  . TRANSIENT ISCHEMIC ATTACK 05/21/2009    Past Surgical History:  Procedure Laterality Date  . CATARACT EXTRACTION     Right     OB History    Gravida      Para      Term      Preterm      AB      Living  3     SAB      TAB      Ectopic      Multiple      Live Births               Home Medications    Prior to Admission medications   Medication Sig Start Date End Date Taking? Authorizing Provider  aspirin EC 81 MG tablet Take 81 mg by mouth every morning.    [provider]  clopidogrel (PLAVIX) 75 MG tablet Take 75 mg by mouth every morning.     [provider]  levothyroxine (SYNTHROID) 100 MCG tablet Take 100 mcg by mouth every morning. *Takes 30 minutes to 1 hour before taking other medications     [provider]  metoprolol (TOPROL-XL) 50 MG 24 hr tablet Take 50 mg by mouth every morning.     [provider]  Olmesartan-Amlodipine-HCTZ (TRIBENZOR) 40-5-12.5 MG TABS Take 0.5 tablets by mouth every morning. 07/14/12   Kari BaarsHawkins, Edward, MD  ondansetron (ZOFRAN ODT) 8 MG disintegrating tablet 8mg  ODT q4 hours prn nausea Patient not taking: Reported on 02/22/2019 03/30/17   Geoffery Lyonselo, Douglas, MD  Polyethyl Glycol-Propyl Glycol (SYSTANE OP) Apply 1 drop to eye daily as needed. For dry eye relief    [provider]  pravastatin (PRAVACHOL) 40 MG tablet Take 40 mg by mouth every morning.     [provider]  traMADol-acetaminophen (ULTRACET) 37.5-325 MG tablet Take 1 tablet by mouth every 6 (six) hours as needed for moderate pain. Patient not taking: Reported on 02/22/2019 02/15/19   Daleen Bo, MD    Family History Family History  Problem Relation Age of Onset  . Heart disease Mother   . Hypertension Mother     Social History Social History   Tobacco Use  . Smoking status: Never Smoker  . Smokeless tobacco: Never Used  Substance Use Topics  . Alcohol use: No  . Drug use: No     Allergies   Sulfonamide derivatives   Review of Systems Review of Systems  Constitutional: Negative for fever.  HENT: Negative for rhinorrhea and sore throat.   Eyes: Negative for redness.  Respiratory: Negative for cough.   Cardiovascular: Negative for chest pain.  Gastrointestinal: Negative  for abdominal pain, diarrhea, nausea and vomiting.  Genitourinary: Negative for dysuria.  Musculoskeletal: Positive for back pain and myalgias.  Skin: Negative for rash.  Neurological: Negative for headaches.     Physical Exam Updated Vital Signs BP (!) 175/55 (BP Location: Left Arm)   Pulse 60   Temp 98 F (36.7 C) (Oral)   Resp 16   Ht 5\' 2"  (1.575 m)   Wt 48.1 kg   SpO2 100%   BMI 19.39 kg/m   Physical Exam Vitals signs and nursing note reviewed.  Constitutional:      Appearance: She is well-developed.  HENT:     Head: Normocephalic and atraumatic.     Ears:     Comments: Pt hard of hearing, hearing aids in place.  Eyes:     General:        Right eye: No discharge.        Left eye: No discharge.     Conjunctiva/sclera: Conjunctivae normal.  Neck:     Musculoskeletal: Normal range of motion and neck supple.  Cardiovascular:     Rate and Rhythm: Normal rate and regular rhythm.     Heart sounds: Normal heart sounds.  Pulmonary:     Effort: Pulmonary effort is normal.     Breath sounds: Normal breath sounds.  Abdominal:     Palpations: Abdomen is soft.     Tenderness: There is no abdominal tenderness.  Musculoskeletal:        General: Swelling and tenderness present.     Right hip: She exhibits normal range of motion, normal strength and no tenderness.     Left hip: Normal.     Right knee: Normal.     Left knee: Normal.     Right upper leg: She exhibits no tenderness, no bony tenderness and no swelling.     Right lower leg: No edema.     Left lower leg: No edema.     Comments: R upper extremity in sling, with mild generalized edema.   Skin:    General: Skin is warm and dry.  Neurological:     Mental Status: She is alert.      ED Treatments / Results  Labs (all labs ordered are listed, but only abnormal results are displayed) Labs Reviewed - No data to display  EKG None  Radiology Dg Lumbar Spine Complete  Result Date: 03/02/2019 CLINICAL DATA:   Low back pain after fall 2 weeks ago. EXAM: LUMBAR SPINE - COMPLETE 4+ VIEW COMPARISON:  None. FINDINGS: No fracture  or spondylolisthesis is noted. Diffuse osteopenia is noted. Moderate degenerative disc disease is noted at L1-2, L2-3, L3-4 and L4-5 with anterior osteophyte formation. Mild degenerative disc disease is noted at L5-S1. Atherosclerosis of abdominal aorta is noted. IMPRESSION: Moderate multilevel degenerative disc disease. No acute abnormality seen in the lumbar spine. Aortic Atherosclerosis (ICD10-I70.0). Electronically Signed   By: Lupita RaiderJames  Green Jr M.D.   On: 03/02/2019 14:34   Dg Hips Bilat W Or Wo Pelvis 3-4 Views  Result Date: 03/02/2019 CLINICAL DATA:  Bilateral hip pain after fall 2 weeks ago. EXAM: DG HIP (WITH OR WITHOUT PELVIS) 3-4V BILAT COMPARISON:  None. FINDINGS: There is no evidence of hip fracture or dislocation. There is no evidence of arthropathy or other focal bone abnormality. IMPRESSION: Negative. Electronically Signed   By: Lupita RaiderJames  Green Jr M.D.   On: 03/02/2019 14:35    Procedures Procedures (including critical care time)  Medications Ordered in ED Medications  acetaminophen (TYLENOL) tablet 650 mg (650 mg Oral Given 03/02/19 1339)     Initial Impression / Assessment and Plan / ED Course  I have reviewed the triage vital signs and the nursing notes.  Pertinent labs & imaging results that were available during my care of the patient were reviewed by me and considered in my medical decision making (see chart for details).        Patient seen and examined. Work-up initiated. Medications ordered.   Vital signs reviewed and are as follows: BP (!) 175/55 (BP Location: Left Arm)   Pulse 60   Temp 98 F (36.7 C) (Oral)   Resp 16   Ht 5\' 2"  (1.575 m)   Wt 48.1 kg   SpO2 100%   BMI 19.39 kg/m   3:15 PM Patient discussed with and seen by Dr. Hyacinth MeekerMiller.   X-rays reviewed with patient at bedside.  I have also spoken with the patient's daughter, Leah Casey, and  relayed results.  We discussed risks and benefits of NSAIDs.  Patient is no longer on Plavix.  Discussed that they could try 200 to 400 mg of ibuprofen at bedtime.  Also will give prescription for topical NSAID.  Patient has a follow-up with orthopedic office in about 2 weeks for her fracture.  Encouraged orthopedic and primary care follow-up if pain is persistent.  Otherwise continue heating pad, Tylenol.   Final Clinical Impressions(s) / ED Diagnoses   Final diagnoses:  Pain in both lower extremities   Patient with hip and thigh pain after a fall 2 weeks ago.  X-rays today do not show any pelvic or hip fractures.  Degenerative disease in the lumbar spine is noted.  No indication for admission today.  Treatment plan as above.  Lower extremities are neurovascularly intact.  Previous arm fracture is in a sling.  ED Discharge Orders         Ordered    diclofenac sodium (VOLTAREN) 1 % GEL  4 times daily     03/02/19 1511           Renne CriglerGeiple, Treasure Ochs, PA-C 03/02/19 1518    Eber HongMiller, Brian, MD 03/03/19 [phone]

## 2019-03-02 NOTE — ED Notes (Signed)
Pt c/o pain in both upper legs, at this time left leg hurts worse than R.  Pedal pulses present.

## 2019-03-02 NOTE — Discharge Instructions (Signed)
Please read and follow all provided instructions.  Your diagnoses today include:  1. Pain in both lower extremities     Tests performed today include:  An x-ray of the affected areas (hips, pelvis, lower back) - does NOT show any broken bones  Vital signs. See below for your results today.   Medications prescribed:   Voltaren gel - topical anti-inflammatory  Take any prescribed medications only as directed.  Home care instructions:   Follow any educational materials contained in this packet  You may use 200-400mg  ibuprofen at bedtime, stop with stomach pain or blood in the stool  Follow R.I.C.E. Protocol:  R - rest your injury   I  - use ice on injury without applying directly to skin  C - compress injury with bandage or splint  E - elevate the injury as much as possible  Follow-up instructions: Please follow-up with your primary care provider or your orthopedic physician (bone specialist) for a recheck.   Return instructions:   Please return if your toes or feet are numb or tingling, appear gray or blue, or you have severe pain (also elevate the leg and loosen splint or wrap if you were given one)  Please return to the Emergency Department if you experience worsening symptoms.   Please return if you have any other emergent concerns.  Additional Information:  Your vital signs today were: BP (!) 175/55 (BP Location: Left Arm)    Pulse 60    Temp 98 F (36.7 C) (Oral)    Resp 16    Ht 5\' 2"  (1.575 m)    Wt 48.1 kg    SpO2 100%    BMI 19.39 kg/m  If your blood pressure (BP) was elevated above 135/85 this visit, please have this repeated by your doctor within one month. --------------

## 2019-03-02 NOTE — ED Provider Notes (Signed)
Medical screening examination/treatment/procedure(s) were conducted as a shared visit with non-physician practitioner(s) and myself.  I personally evaluated the patient during the encounter.  Clinical Impression:   Final diagnoses:  Pain in both lower extremities    The patient is a very pleasant 85 year old female who had a fall a couple of days ago, she has complaints of pain in her bilateral upper legs radiating up into the pelvis.  On exam she has pain with range of motion of either leg but compartments are very soft and there is no leg length discrepancies, she has no pain with manipulation of the pelvis.  She has had a fracture of her humerus which is appropriately in a sling.  X-rays pending to rule out fractures of the pelvis or femurs.   Leah Chapel, MD 03/03/19 [phone]

## 2019-03-04 NOTE — Telephone Encounter (Signed)
Patient's daughter, Eulogio Bear called and stated that they took Ms. Gomes to the ER this weekend due to her hip/back pain.  She said xrays were done and were negative.  She asks that you review the xrays and call them.  Please let them know if you agree with this diagnosis or if you see something different    The daughter, Orie Fisherman phone number is [phone]  Thanks

## 2019-03-04 NOTE — Telephone Encounter (Signed)
Chart reviewed   My evaluation is limited to chart review   ER record looks good, agree with the er record

## 2019-03-05 ENCOUNTER — Telehealth: Payer: Self-pay | Admitting: Orthopedic Surgery

## 2019-03-05 NOTE — Telephone Encounter (Signed)
I spoke with Ms. Rivere daughter, Ardyth Gal.  She states that since her mother fell, she has not been up walking around like she used to due to the pain in her hips and legs.  She is having muscle spasms in her groin, quads and thigh areas.  She said that they rub Ms. Passey' hips, knees and thighs with the Voltaren gel, Theraworx and/or Biofreeze but nothing seems to help.  She said that Ms. Atkerson was previously given Tramadol when she was at ER and that was too strong for her.  She said that she had tried Ibuprofen but that made her feet swell.  She said she is back to taking Extra Strength Tylenol but she continues to have the muscle spasms.  Juliann Pulse asked about using either ice or heat for the cramps.  I told her that I would have to ask Dr. Aline Brochure about this.   We discussed that due to Ms. Harkless age, her skin was assuredly really thin and there would need to be a lot of care taken if this was used.  I told her that I would ask Dr. Aline Brochure about using ice or heat and any other suggestions that he may have.  Can she use ice or heat?  Any other suggestions?  Please advice  Thanks

## 2019-03-05 NOTE — Telephone Encounter (Signed)
Patient's daughter was contacted today; relayed.

## 2019-03-06 ENCOUNTER — Telehealth: Payer: Self-pay | Admitting: Orthopedic Surgery

## 2019-03-06 NOTE — Telephone Encounter (Signed)
Muscle spasms: try tizanidine 4 mg q6 prn

## 2019-03-06 NOTE — Telephone Encounter (Signed)
Call received from Kathlee Nations, nurse with [facility], [phone], states she noted at home visit right arm swelling, moreso than last time, and light pink in color, also cold to touch.  States daughter/family concerned about how much she should be moving the arm* *I relayed that clinical staff had spoken with patient's daughter and designated contact on file, Eulogio Bear, and that patient was worked in for appointment with Dr Aline Brochure this Friday morning, 03/08/19(next scheduled day). If need to call back with any other advice, please call back at above phone#

## 2019-03-06 NOTE — Telephone Encounter (Signed)
She is sensitive to medications, and daughter has advised she does not want her to try this, I have told her if she would like she can try at night only, but daughter has declined. Daughter  has told me also the nurse is very worried about patients arm, it is very red and swollen, the laceration is fine, but entire arm swollen and red.  I have worked her in on Friday for a follow up of her arm.

## 2019-03-06 NOTE — Telephone Encounter (Signed)
Thanks for letting her know about the workin appointment, appreciate it.

## 2019-03-08 ENCOUNTER — Ambulatory Visit (INDEPENDENT_AMBULATORY_CARE_PROVIDER_SITE_OTHER): Payer: Medicare Other | Admitting: Orthopedic Surgery

## 2019-03-08 ENCOUNTER — Encounter: Payer: Self-pay | Admitting: Orthopedic Surgery

## 2019-03-08 ENCOUNTER — Other Ambulatory Visit: Payer: Self-pay

## 2019-03-08 VITALS — Temp 97.3°F

## 2019-03-08 DIAGNOSIS — R252 Cramp and spasm: Secondary | ICD-10-CM

## 2019-03-08 MED ORDER — TIZANIDINE HCL 2 MG PO CAPS: 2.0000 mg | ORAL_CAPSULE | Freq: Every day | ORAL | 1 refills | Status: AC

## 2019-03-08 NOTE — Progress Notes (Signed)
Chief Complaint  Patient presents with  . Arm Injury    02/15/2019 right shoulder fracture / Leah Casey with home therapy concerned about swelling and redness     The patient came in today to have [place] look at her arm because of swelling.  The arm is swollen has no skin issues compartments are soft  She also has some other complaints of cramping and after a long discussion we decided to put her on tizanidine electrolytes and laboratory tests are referred to Dr. Luan Pulling office for any abnormalities  Come back for x-rays as scheduled

## 2019-03-11 ENCOUNTER — Telehealth: Payer: Self-pay

## 2019-03-11 NOTE — Telephone Encounter (Signed)
I called and daughter has advised no need for the OT. Mom has stopped eating and is vomiting now. She states she will let me know if anything changes and I can send order then.  To you FYI only

## 2019-03-11 NOTE — Telephone Encounter (Signed)
Patient's daughter(Becky) left message on voicemail late Friday afternoon asking if Dr. Aline Brochure thought her mom would benefit from having OT.  Please advise

## 2019-03-11 NOTE — Telephone Encounter (Signed)
Yes

## 2019-03-11 NOTE — Telephone Encounter (Signed)
Patient's daughter(Becky) left message on voicemail late Friday afternoon asking if Dr. Aline Brochure thought her mom would benefit from having OT.  Please call and advise

## 2019-03-18 ENCOUNTER — Ambulatory Visit: Payer: Medicare Other | Admitting: Orthopedic Surgery

## 2019-04-16 DEATH — deceased

## 2020-02-09 IMAGING — DX LUMBAR SPINE - COMPLETE 4+ VIEW
5 series · 5 of 5 positions shown · non-contrast
Comparison: None.

CLINICAL DATA: Low back pain after fall 2 weeks ago.

EXAM:
LUMBAR SPINE - COMPLETE 4+ VIEW

[l-spine ap]
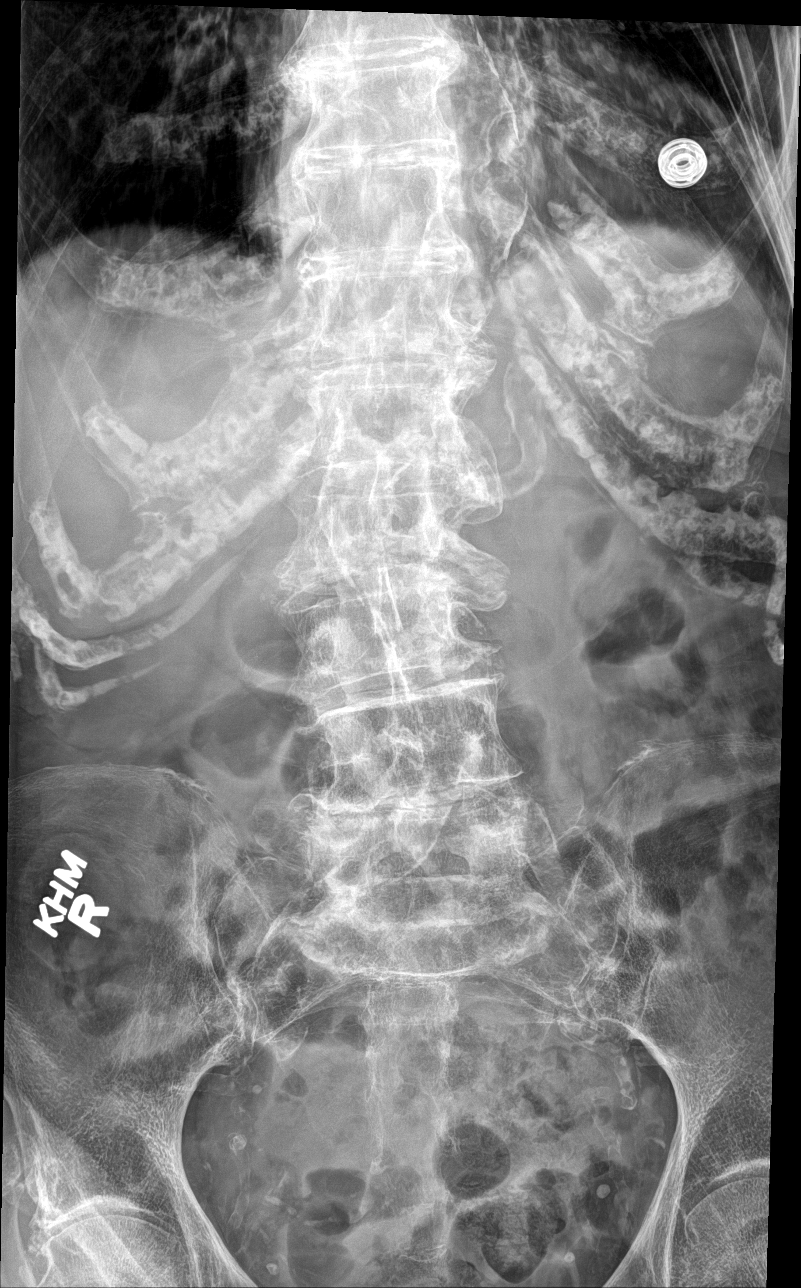

[l-spine obl (1 of 2)]
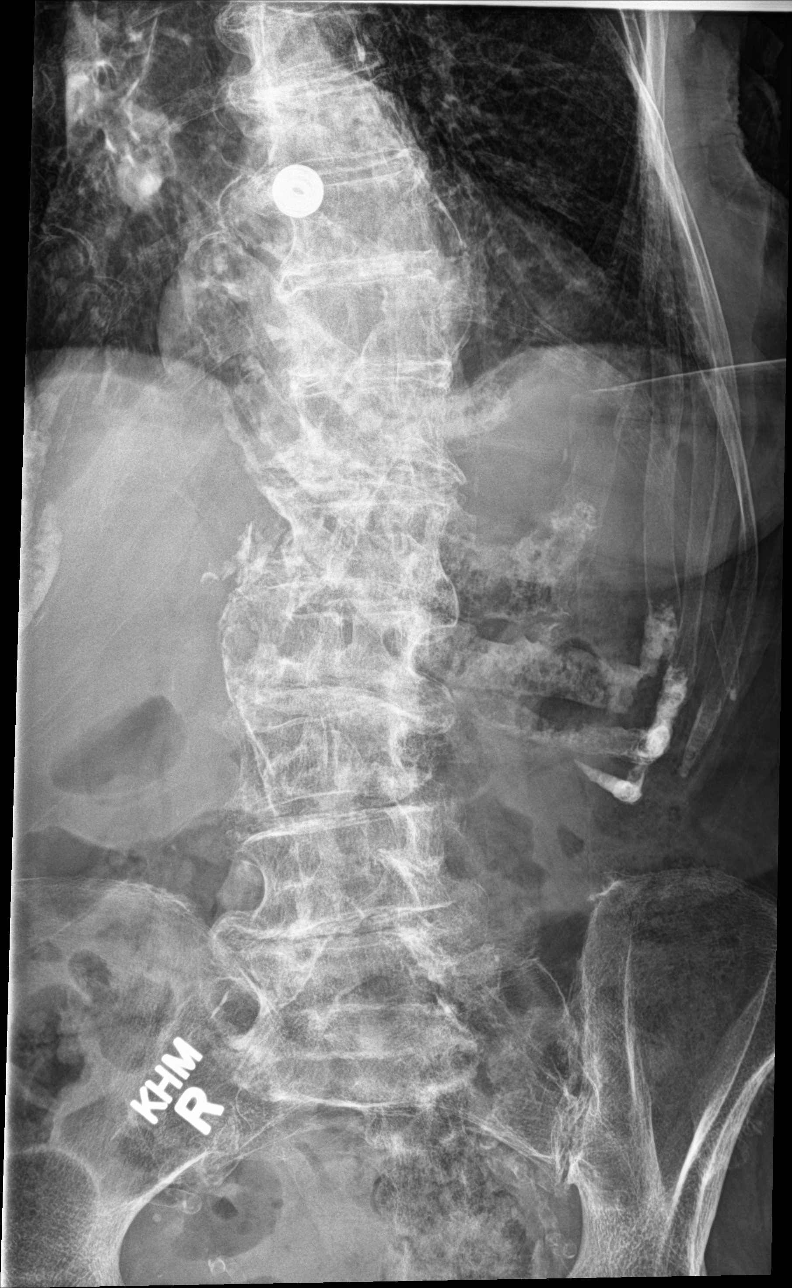

[l-spine obl (2 of 2)]
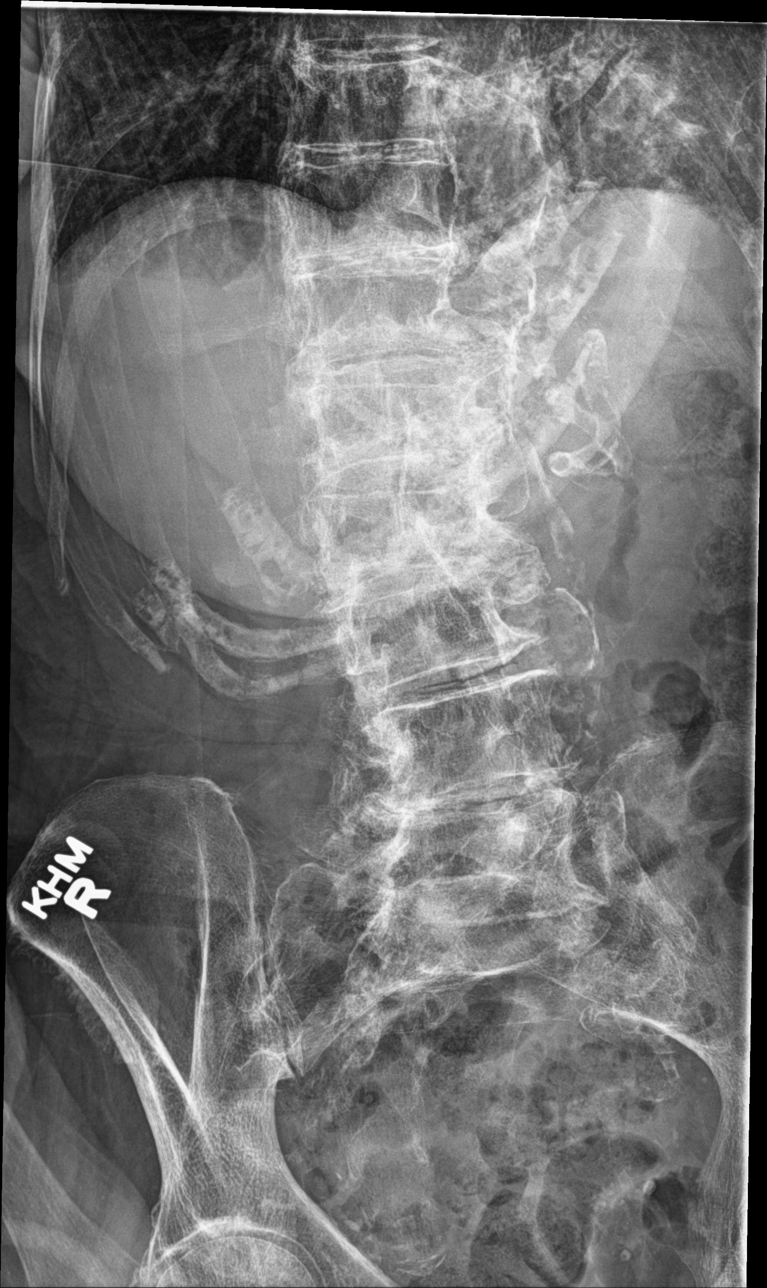

[l-spine lat]
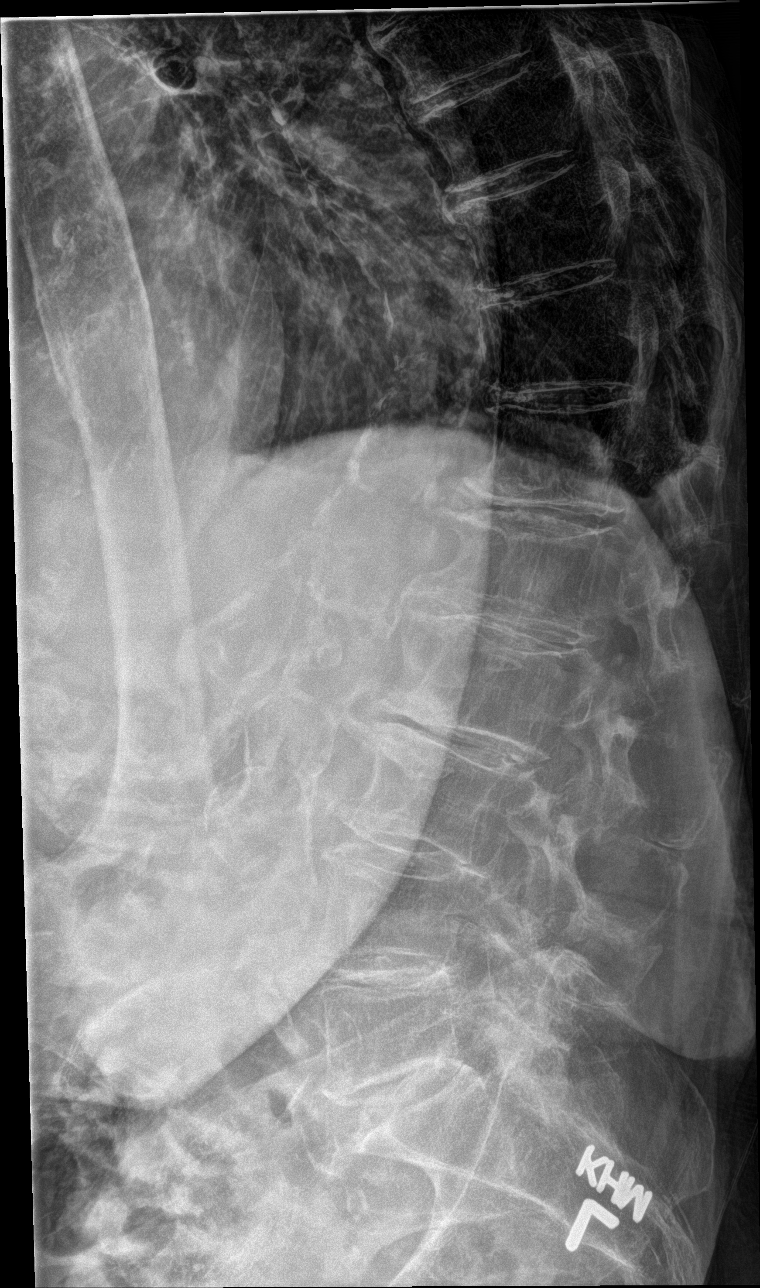

[l-spine spot]
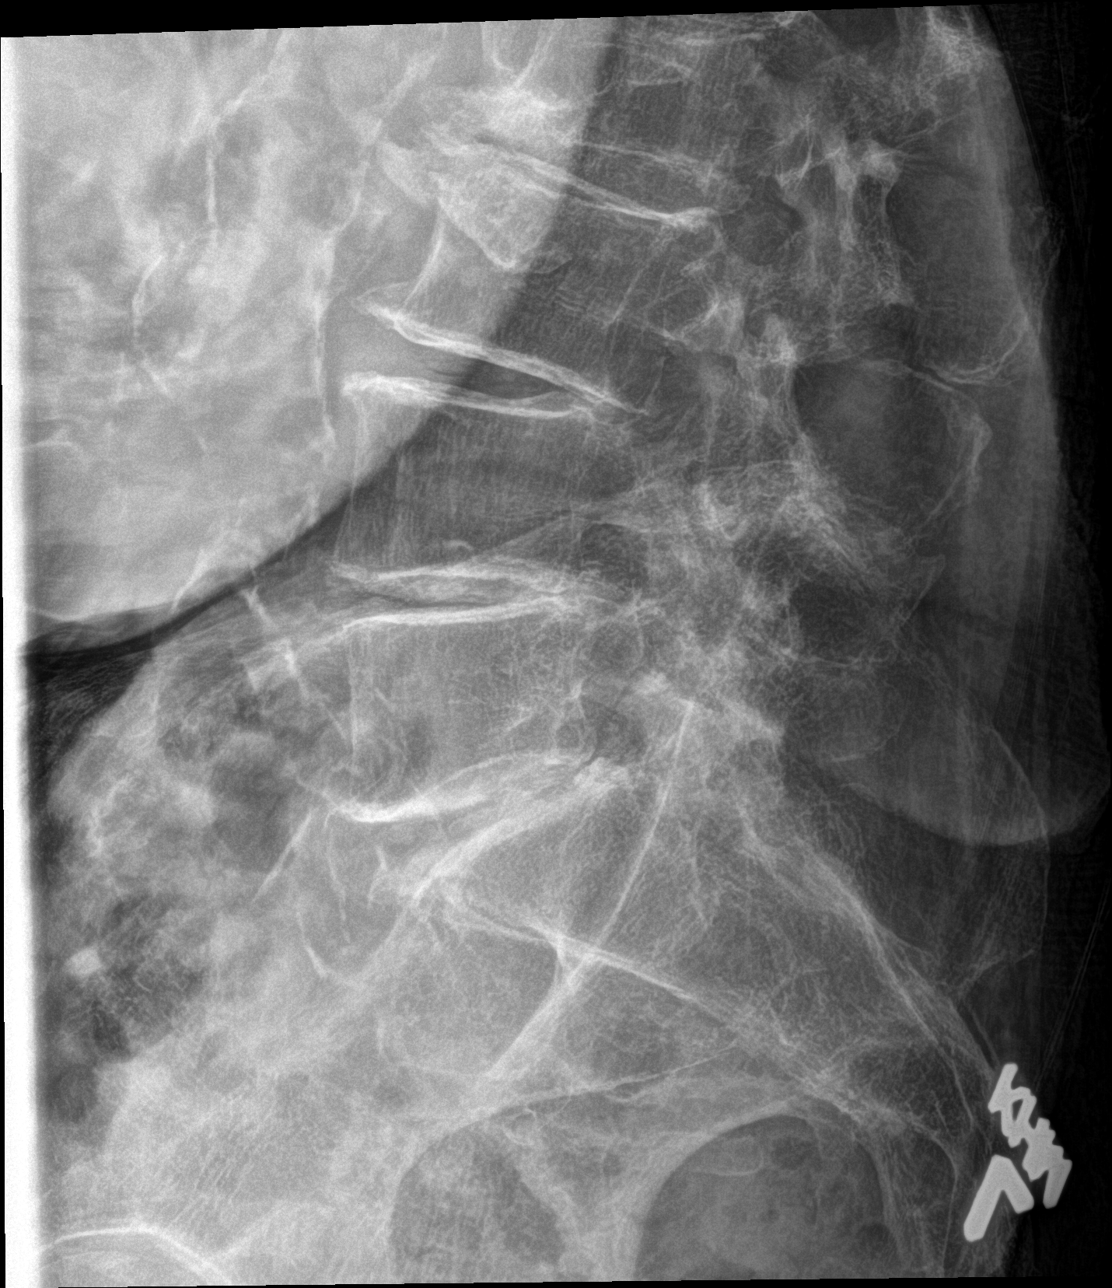

[5 of 5 positions shown; findings below may reference images not displayed]

FINDINGS: No fracture or spondylolisthesis is noted. Diffuse osteopenia is
noted. Moderate degenerative disc disease is noted at L1-2, L2-3,
L3-4 and L4-5 with anterior osteophyte formation. Mild degenerative
disc disease is noted at L5-S1. Atherosclerosis of abdominal aorta
is noted.
IMPRESSION: Moderate multilevel degenerative disc disease. No acute abnormality
seen in the lumbar spine.

Aortic Atherosclerosis ([id number].R).
